# Patient Record
Sex: Female | Born: 1955 | Race: White | Hispanic: No | Marital: Single | Smoking: Never smoker
Health system: Southern US, Community
[De-identification: ages and names within clinical notes are randomized; demographics above are authoritative.]

## PROBLEM LIST (undated history)

## (undated) DIAGNOSIS — T783XXA Angioneurotic edema, initial encounter: Secondary | ICD-10-CM

## (undated) DIAGNOSIS — C801 Malignant (primary) neoplasm, unspecified: Secondary | ICD-10-CM

## (undated) DIAGNOSIS — L309 Dermatitis, unspecified: Secondary | ICD-10-CM

## (undated) DIAGNOSIS — E119 Type 2 diabetes mellitus without complications: Secondary | ICD-10-CM

## (undated) DIAGNOSIS — Z9889 Other specified postprocedural states: Secondary | ICD-10-CM

## (undated) DIAGNOSIS — R112 Nausea with vomiting, unspecified: Secondary | ICD-10-CM

## (undated) DIAGNOSIS — L509 Urticaria, unspecified: Secondary | ICD-10-CM

## (undated) DIAGNOSIS — J45909 Unspecified asthma, uncomplicated: Secondary | ICD-10-CM

## (undated) HISTORY — DX: Urticaria, unspecified: L50.9

## (undated) HISTORY — PX: EYE SURGERY: SHX253

## (undated) HISTORY — DX: Dermatitis, unspecified: L30.9

## (undated) HISTORY — DX: Type 2 diabetes mellitus without complications: E11.9

## (undated) HISTORY — DX: Angioneurotic edema, initial encounter: T78.3XXA

## (undated) HISTORY — PX: WRIST SURGERY: SHX841

---

## 1999-12-20 DIAGNOSIS — C801 Malignant (primary) neoplasm, unspecified: Secondary | ICD-10-CM

## 1999-12-20 HISTORY — DX: Malignant (primary) neoplasm, unspecified: C80.1

## 2004-10-13 ENCOUNTER — Emergency Department (HOSPITAL_COMMUNITY): Admission: EM | Admit: 2004-10-13 | Discharge: 2004-10-13 | Payer: Self-pay | Admitting: Emergency Medicine

## 2005-11-08 ENCOUNTER — Ambulatory Visit (HOSPITAL_COMMUNITY): Admission: RE | Admit: 2005-11-08 | Discharge: 2005-11-08 | Payer: Self-pay | Admitting: Vascular Surgery

## 2009-10-04 ENCOUNTER — Ambulatory Visit (HOSPITAL_COMMUNITY): Admission: RE | Admit: 2009-10-04 | Discharge: 2009-10-04 | Payer: Self-pay | Admitting: Internal Medicine

## 2013-06-24 ENCOUNTER — Other Ambulatory Visit: Payer: Self-pay | Admitting: Internal Medicine

## 2013-06-24 DIAGNOSIS — R928 Other abnormal and inconclusive findings on diagnostic imaging of breast: Secondary | ICD-10-CM

## 2013-06-24 DIAGNOSIS — Z853 Personal history of malignant neoplasm of breast: Secondary | ICD-10-CM

## 2013-06-24 DIAGNOSIS — Z9889 Other specified postprocedural states: Secondary | ICD-10-CM

## 2014-07-18 ENCOUNTER — Encounter (INDEPENDENT_AMBULATORY_CARE_PROVIDER_SITE_OTHER): Admitting: Ophthalmology

## 2014-07-18 DIAGNOSIS — H538 Other visual disturbances: Secondary | ICD-10-CM | POA: Diagnosis not present

## 2014-07-18 DIAGNOSIS — H43819 Vitreous degeneration, unspecified eye: Secondary | ICD-10-CM

## 2016-04-25 ENCOUNTER — Emergency Department (HOSPITAL_COMMUNITY): Payer: No Typology Code available for payment source

## 2016-04-25 ENCOUNTER — Encounter (HOSPITAL_COMMUNITY): Payer: Self-pay

## 2016-04-25 ENCOUNTER — Encounter (HOSPITAL_BASED_OUTPATIENT_CLINIC_OR_DEPARTMENT_OTHER): Payer: Self-pay | Admitting: *Deleted

## 2016-04-25 ENCOUNTER — Emergency Department (HOSPITAL_COMMUNITY)
Admission: EM | Admit: 2016-04-25 | Discharge: 2016-04-25 | Disposition: A | Discharge status: Home / Self Care | Payer: No Typology Code available for payment source | Attending: Emergency Medicine | Admitting: Emergency Medicine

## 2016-04-25 ENCOUNTER — Other Ambulatory Visit: Payer: Self-pay | Admitting: Orthopedic Surgery

## 2016-04-25 DIAGNOSIS — S52501A Unspecified fracture of the lower end of right radius, initial encounter for closed fracture: Secondary | ICD-10-CM | POA: Diagnosis not present

## 2016-04-25 DIAGNOSIS — S5291XA Unspecified fracture of right forearm, initial encounter for closed fracture: Secondary | ICD-10-CM

## 2016-04-25 DIAGNOSIS — Y999 Unspecified external cause status: Secondary | ICD-10-CM | POA: Diagnosis not present

## 2016-04-25 DIAGNOSIS — J45909 Unspecified asthma, uncomplicated: Secondary | ICD-10-CM | POA: Diagnosis not present

## 2016-04-25 DIAGNOSIS — Y92410 Unspecified street and highway as the place of occurrence of the external cause: Secondary | ICD-10-CM | POA: Insufficient documentation

## 2016-04-25 DIAGNOSIS — S6991XA Unspecified injury of right wrist, hand and finger(s), initial encounter: Secondary | ICD-10-CM | POA: Diagnosis present

## 2016-04-25 DIAGNOSIS — S52611A Displaced fracture of right ulna styloid process, initial encounter for closed fracture: Secondary | ICD-10-CM

## 2016-04-25 DIAGNOSIS — Y939 Activity, unspecified: Secondary | ICD-10-CM | POA: Insufficient documentation

## 2016-04-25 DIAGNOSIS — S52614A Nondisplaced fracture of right ulna styloid process, initial encounter for closed fracture: Secondary | ICD-10-CM | POA: Insufficient documentation

## 2016-04-25 HISTORY — DX: Unspecified asthma, uncomplicated: J45.909

## 2016-04-25 MED ORDER — OXYCODONE-ACETAMINOPHEN 5-325 MG PO TABS
1.0000 | ORAL_TABLET | Freq: Once | ORAL | Status: AC
Start: 2016-04-25 — End: 2016-04-25
  Administered 2016-04-25: 1 via ORAL
  Filled 2016-04-25: qty 1

## 2016-04-25 MED ORDER — HYDROMORPHONE HCL 1 MG/ML IJ SOLN: 1.0000 mg | Freq: Once | INTRAMUSCULAR | Status: DC

## 2016-04-25 MED ORDER — ONDANSETRON 4 MG PO TBDP
8.0000 mg | ORAL_TABLET | Freq: Once | ORAL | Status: AC
  Administered 2016-04-25: 8 mg via ORAL
  Filled 2016-04-25: qty 2

## 2016-04-25 NOTE — Discharge Instructions (Signed)
Discharge medications and follow-up appointments per physician assistant and registered nurse.

## 2016-04-25 NOTE — ED Notes (Signed)
Ortho at bedside.
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
## 2016-04-25 NOTE — ED Notes (Signed)
Ortho MD at bedside to splint patient's arm
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
## 2016-04-26 ENCOUNTER — Ambulatory Visit (HOSPITAL_BASED_OUTPATIENT_CLINIC_OR_DEPARTMENT_OTHER): Payer: No Typology Code available for payment source | Admitting: Anesthesiology

## 2016-04-26 ENCOUNTER — Encounter (HOSPITAL_BASED_OUTPATIENT_CLINIC_OR_DEPARTMENT_OTHER): Payer: Self-pay | Admitting: *Deleted

## 2016-04-26 ENCOUNTER — Ambulatory Visit (HOSPITAL_BASED_OUTPATIENT_CLINIC_OR_DEPARTMENT_OTHER)
Admission: RE | Admit: 2016-04-26 | Discharge: 2016-04-27 | Disposition: A | Discharge status: Home / Self Care | Payer: No Typology Code available for payment source | Source: Ambulatory Visit | Attending: Orthopedic Surgery | Admitting: Orthopedic Surgery

## 2016-04-26 ENCOUNTER — Encounter (HOSPITAL_BASED_OUTPATIENT_CLINIC_OR_DEPARTMENT_OTHER)
Admission: RE | Disposition: A | Discharge status: Home / Self Care | Payer: Self-pay | Source: Ambulatory Visit | Attending: Orthopedic Surgery

## 2016-04-26 DIAGNOSIS — Z79899 Other long term (current) drug therapy: Secondary | ICD-10-CM | POA: Diagnosis not present

## 2016-04-26 DIAGNOSIS — X58XXXA Exposure to other specified factors, initial encounter: Secondary | ICD-10-CM | POA: Diagnosis not present

## 2016-04-26 DIAGNOSIS — S52501A Unspecified fracture of the lower end of right radius, initial encounter for closed fracture: Secondary | ICD-10-CM | POA: Diagnosis present

## 2016-04-26 DIAGNOSIS — J45909 Unspecified asthma, uncomplicated: Secondary | ICD-10-CM | POA: Diagnosis not present

## 2016-04-26 DIAGNOSIS — S52571A Other intraarticular fracture of lower end of right radius, initial encounter for closed fracture: Secondary | ICD-10-CM | POA: Diagnosis not present

## 2016-04-26 HISTORY — DX: Other specified postprocedural states: Z98.890

## 2016-04-26 HISTORY — PX: OPEN REDUCTION INTERNAL FIXATION (ORIF) DISTAL RADIAL FRACTURE: SHX5989

## 2016-04-26 HISTORY — DX: Malignant (primary) neoplasm, unspecified: C80.1

## 2016-04-26 HISTORY — DX: Other specified postprocedural states: R11.2

## 2016-04-26 SURGERY — OPEN REDUCTION INTERNAL FIXATION (ORIF) DISTAL RADIUS FRACTURE
Anesthesia: Regional | Site: Wrist | Laterality: Right

## 2016-04-26 MED ORDER — ONDANSETRON HCL 4 MG PO TABS: 4.0000 mg | ORAL_TABLET | Freq: Four times a day (QID) | ORAL | Status: DC | PRN

## 2016-04-26 MED ORDER — MIDAZOLAM HCL 2 MG/2ML IJ SOLN
1.0000 mg | INTRAMUSCULAR | Status: DC | PRN
  Administered 2016-04-26: 2 mg via INTRAVENOUS

## 2016-04-26 MED ORDER — ONDANSETRON HCL 4 MG/2ML IJ SOLN: 4.0000 mg | Freq: Four times a day (QID) | INTRAMUSCULAR | Status: DC | PRN

## 2016-04-26 MED ORDER — SODIUM CHLORIDE 0.9 % IJ SOLN
INTRAMUSCULAR | Status: AC
  Filled 2016-04-26: qty 10

## 2016-04-26 MED ORDER — ONDANSETRON HCL 4 MG/2ML IJ SOLN
INTRAMUSCULAR | Status: DC | PRN
  Administered 2016-04-26: 4 mg via INTRAVENOUS

## 2016-04-26 MED ORDER — CEFAZOLIN SODIUM-DEXTROSE 2-4 GM/100ML-% IV SOLN
INTRAVENOUS | Status: AC
  Filled 2016-04-26: qty 100

## 2016-04-26 MED ORDER — MIDAZOLAM HCL 2 MG/2ML IJ SOLN
INTRAMUSCULAR | Status: AC
  Filled 2016-04-26: qty 2

## 2016-04-26 MED ORDER — PROPOFOL 10 MG/ML IV BOLUS
INTRAVENOUS | Status: DC | PRN
  Administered 2016-04-26: 20 mg via INTRAVENOUS

## 2016-04-26 MED ORDER — PROPOFOL 500 MG/50ML IV EMUL
INTRAVENOUS | Status: AC
  Filled 2016-04-26: qty 50

## 2016-04-26 MED ORDER — CEFAZOLIN SODIUM 1-5 GM-% IV SOLN
1.0000 g | Freq: Three times a day (TID) | INTRAVENOUS | Status: DC
  Administered 2016-04-26 – 2016-04-27 (×2): 1 g via INTRAVENOUS
  Filled 2016-04-26: qty 50

## 2016-04-26 MED ORDER — METHOCARBAMOL 1000 MG/10ML IJ SOLN
INTRAMUSCULAR | Status: AC
  Filled 2016-04-26: qty 10

## 2016-04-26 MED ORDER — FENTANYL CITRATE (PF) 100 MCG/2ML IJ SOLN
50.0000 ug | INTRAMUSCULAR | Status: DC | PRN
  Administered 2016-04-26: 100 ug via INTRAVENOUS

## 2016-04-26 MED ORDER — FAMOTIDINE 20 MG PO TABS: 20.0000 mg | ORAL_TABLET | Freq: Two times a day (BID) | ORAL | Status: DC | PRN

## 2016-04-26 MED ORDER — ONDANSETRON HCL 4 MG/2ML IJ SOLN: 4.0000 mg | Freq: Once | INTRAMUSCULAR | Status: DC | PRN

## 2016-04-26 MED ORDER — ONDANSETRON HCL 4 MG/2ML IJ SOLN
INTRAMUSCULAR | Status: AC
  Filled 2016-04-26: qty 2

## 2016-04-26 MED ORDER — CEFAZOLIN SODIUM 1-5 GM-% IV SOLN
1.0000 g | INTRAVENOUS | Status: DC
  Filled 2016-04-26: qty 50

## 2016-04-26 MED ORDER — DOCUSATE SODIUM 100 MG PO CAPS: 100.0000 mg | ORAL_CAPSULE | Freq: Two times a day (BID) | ORAL | Status: DC

## 2016-04-26 MED ORDER — PROMETHAZINE HCL 12.5 MG RE SUPP: 12.5000 mg | Freq: Four times a day (QID) | RECTAL | Status: DC | PRN

## 2016-04-26 MED ORDER — HYDROMORPHONE HCL 2 MG PO TABS
2.0000 mg | ORAL_TABLET | ORAL | Status: DC | PRN
  Administered 2016-04-27: 4 mg via ORAL
  Administered 2016-04-27: 2 mg via ORAL
  Filled 2016-04-26 (×3): qty 1

## 2016-04-26 MED ORDER — LACTATED RINGERS IV SOLN: INTRAVENOUS | Status: DC

## 2016-04-26 MED ORDER — LACTATED RINGERS IV SOLN
INTRAVENOUS | Status: DC
  Administered 2016-04-26 (×2): via INTRAVENOUS

## 2016-04-26 MED ORDER — GLYCOPYRROLATE 0.2 MG/ML IJ SOLN: 0.2000 mg | Freq: Once | INTRAMUSCULAR | Status: DC | PRN

## 2016-04-26 MED ORDER — CEFAZOLIN SODIUM-DEXTROSE 2-4 GM/100ML-% IV SOLN
2.0000 g | INTRAVENOUS | Status: AC
  Administered 2016-04-26: 2 g via INTRAVENOUS

## 2016-04-26 MED ORDER — ALPRAZOLAM 0.5 MG PO TABS
0.5000 mg | ORAL_TABLET | Freq: Four times a day (QID) | ORAL | Status: DC | PRN
  Administered 2016-04-26: 0.5 mg via ORAL
  Filled 2016-04-26: qty 2

## 2016-04-26 MED ORDER — PROMETHAZINE HCL 25 MG/ML IJ SOLN
6.2500 mg | Freq: Once | INTRAMUSCULAR | Status: AC
  Administered 2016-04-26: 6.25 mg via INTRAVENOUS

## 2016-04-26 MED ORDER — VITAMIN C 500 MG PO TABS: 1000.0000 mg | ORAL_TABLET | Freq: Every day | ORAL | Status: DC

## 2016-04-26 MED ORDER — PROMETHAZINE HCL 12.5 MG PO TABS
12.5000 mg | ORAL_TABLET | Freq: Four times a day (QID) | ORAL | Status: DC | PRN
  Administered 2016-04-27: 12.5 mg via ORAL
  Filled 2016-04-26: qty 1

## 2016-04-26 MED ORDER — HYDROMORPHONE HCL 1 MG/ML IJ SOLN: 0.2500 mg | INTRAMUSCULAR | Status: DC | PRN

## 2016-04-26 MED ORDER — PROMETHAZINE HCL 25 MG/ML IJ SOLN
12.5000 mg | Freq: Four times a day (QID) | INTRAMUSCULAR | Status: DC | PRN
  Administered 2016-04-27: 12.5 mg via INTRAVENOUS
  Filled 2016-04-26: qty 1

## 2016-04-26 MED ORDER — OXYCODONE HCL 5 MG PO TABS
5.0000 mg | ORAL_TABLET | ORAL | Status: DC | PRN
  Administered 2016-04-26: 10 mg via ORAL
  Filled 2016-04-26: qty 2

## 2016-04-26 MED ORDER — FENTANYL CITRATE (PF) 100 MCG/2ML IJ SOLN
INTRAMUSCULAR | Status: AC
  Filled 2016-04-26: qty 2

## 2016-04-26 MED ORDER — HYDROMORPHONE HCL 1 MG/ML IJ SOLN
0.5000 mg | INTRAMUSCULAR | Status: DC | PRN
  Administered 2016-04-26 (×2): 1 mg via INTRAVENOUS
  Filled 2016-04-26 (×2): qty 1

## 2016-04-26 MED ORDER — METHOCARBAMOL 1000 MG/10ML IJ SOLN
500.0000 mg | Freq: Four times a day (QID) | INTRAMUSCULAR | Status: DC | PRN
  Administered 2016-04-26 – 2016-04-27 (×2): 500 mg via INTRAVENOUS

## 2016-04-26 MED ORDER — CHLORHEXIDINE GLUCONATE 4 % EX LIQD: 60.0000 mL | Freq: Once | CUTANEOUS | Status: DC

## 2016-04-26 MED ORDER — METHOCARBAMOL 500 MG PO TABS: 500.0000 mg | ORAL_TABLET | Freq: Four times a day (QID) | ORAL | Status: DC | PRN

## 2016-04-26 MED ORDER — MEPERIDINE HCL 25 MG/ML IJ SOLN: 6.2500 mg | INTRAMUSCULAR | Status: DC | PRN

## 2016-04-26 MED ORDER — PROPOFOL 500 MG/50ML IV EMUL
INTRAVENOUS | Status: DC | PRN
  Administered 2016-04-26: 75 ug/kg/min via INTRAVENOUS

## 2016-04-26 MED ORDER — HYDROMORPHONE HCL 1 MG/ML IJ SOLN
1.0000 mg | INTRAMUSCULAR | Status: DC | PRN
  Administered 2016-04-27: 1 mg via INTRAVENOUS
  Filled 2016-04-26: qty 1

## 2016-04-26 MED ORDER — PROMETHAZINE HCL 25 MG/ML IJ SOLN
INTRAMUSCULAR | Status: AC
  Filled 2016-04-26: qty 1

## 2016-04-26 MED ORDER — SCOPOLAMINE 1 MG/3DAYS TD PT72: 1.0000 | MEDICATED_PATCH | Freq: Once | TRANSDERMAL | Status: DC | PRN

## 2016-04-26 SURGICAL SUPPLY — 66 items
BANDAGE ACE 3X5.8 VEL STRL LF (GAUZE/BANDAGES/DRESSINGS) ×3 IMPLANT
BANDAGE ACE 4X5 VEL STRL LF (GAUZE/BANDAGES/DRESSINGS) ×3 IMPLANT
BIT DRILL 2.2 SS TIBIAL (BIT) ×3 IMPLANT
BLADE CARPAL TUNNEL SNGL USE (BLADE) IMPLANT
BLADE SURG 15 STRL LF DISP TIS (BLADE) ×2 IMPLANT
BLADE SURG 15 STRL SS (BLADE) ×4
BNDG CONFORM 3 STRL LF (GAUZE/BANDAGES/DRESSINGS) ×3 IMPLANT
BRUSH SCRUB EZ PLAIN DRY (MISCELLANEOUS) ×3 IMPLANT
CORDS BIPOLAR (ELECTRODE) ×3 IMPLANT
COVER BACK TABLE 60X90IN (DRAPES) ×3 IMPLANT
CUFF TOURNIQUET SINGLE 18IN (TOURNIQUET CUFF) ×3 IMPLANT
DRAIN PENROSE 1/4X12 LTX STRL (WOUND CARE) IMPLANT
DRAPE EXTREMITY T 121X128X90 (DRAPE) ×3 IMPLANT
DRAPE SURG 17X23 STRL (DRAPES) ×3 IMPLANT
DRSG EMULSION OIL 3X3 NADH (GAUZE/BANDAGES/DRESSINGS) ×3 IMPLANT
GAUZE SPONGE 4X4 12PLY STRL (GAUZE/BANDAGES/DRESSINGS) IMPLANT
GAUZE SPONGE 4X4 16PLY XRAY LF (GAUZE/BANDAGES/DRESSINGS) IMPLANT
GAUZE XEROFORM 1X8 LF (GAUZE/BANDAGES/DRESSINGS) ×3 IMPLANT
GAUZE XEROFORM 5X9 LF (GAUZE/BANDAGES/DRESSINGS) ×3 IMPLANT
GLOVE BIO SURGEON STRL SZ 6.5 (GLOVE) ×2 IMPLANT
GLOVE BIO SURGEONS STRL SZ 6.5 (GLOVE) ×1
GLOVE BIOGEL M STRL SZ7.5 (GLOVE) IMPLANT
GLOVE BIOGEL PI IND STRL 7.0 (GLOVE) ×1 IMPLANT
GLOVE BIOGEL PI INDICATOR 7.0 (GLOVE) ×2
GLOVE SS BIOGEL STRL SZ 8 (GLOVE) ×1 IMPLANT
GLOVE SUPERSENSE BIOGEL SZ 8 (GLOVE) ×2
GOWN STRL REUS W/ TWL LRG LVL3 (GOWN DISPOSABLE) ×1 IMPLANT
GOWN STRL REUS W/ TWL XL LVL3 (GOWN DISPOSABLE) ×1 IMPLANT
GOWN STRL REUS W/TWL LRG LVL3 (GOWN DISPOSABLE) ×2
GOWN STRL REUS W/TWL XL LVL3 (GOWN DISPOSABLE) ×2
LOOP VESSEL MAXI BLUE (MISCELLANEOUS) IMPLANT
NDL SAFETY ECLIPSE 18X1.5 (NEEDLE) IMPLANT
NEEDLE HYPO 18GX1.5 SHARP (NEEDLE)
NEEDLE HYPO 22GX1.5 SAFETY (NEEDLE) IMPLANT
NEEDLE HYPO 25X1 1.5 SAFETY (NEEDLE) IMPLANT
NS IRRIG 1000ML POUR BTL (IV SOLUTION) ×3 IMPLANT
PACK BASIN DAY SURGERY FS (CUSTOM PROCEDURE TRAY) ×3 IMPLANT
PAD ALCOHOL SWAB (MISCELLANEOUS) ×24 IMPLANT
PAD CAST 3X4 CTTN HI CHSV (CAST SUPPLIES) ×2 IMPLANT
PAD CAST 4YDX4 CTTN HI CHSV (CAST SUPPLIES) ×1 IMPLANT
PADDING CAST ABS 4INX4YD NS (CAST SUPPLIES)
PADDING CAST ABS COTTON 4X4 ST (CAST SUPPLIES) IMPLANT
PADDING CAST COTTON 3X4 STRL (CAST SUPPLIES) ×4
PADDING CAST COTTON 4X4 STRL (CAST SUPPLIES) ×2
PEG LOCKING SMOOTH 2.2X18 (Peg) ×6 IMPLANT
PEG LOCKING SMOOTH 2.2X20 (Screw) ×6 IMPLANT
PEG LOCKING SMOOTH 2.2X22 (Screw) ×3 IMPLANT
PLATE MEDIUM DVR LEFT (Plate) ×3 IMPLANT
PLATE MEDIUM DVR RIGHT (Plate) ×3 IMPLANT
PUTTY DBM STAGRAFT 5CC (Putty) ×3 IMPLANT
SCREW LOCK 14X2.7X 3 LD TPR (Screw) ×3 IMPLANT
SCREW LOCKING 2.7X13MM (Screw) ×6 IMPLANT
SCREW LOCKING 2.7X14 (Screw) ×6 IMPLANT
SCREW LOCKING 2.7X15MM (Screw) ×3 IMPLANT
SCREW MULTI DIRECTIONAL 2.7X18 (Screw) ×3 IMPLANT
SHEET MEDIUM DRAPE 40X70 STRL (DRAPES) ×3 IMPLANT
SPLINT FIBERGLASS 4X30 (CAST SUPPLIES) ×3 IMPLANT
STOCKINETTE 4X48 STRL (DRAPES) ×3 IMPLANT
SUT PROLENE 4 0 PS 2 18 (SUTURE) ×6 IMPLANT
SYR BULB 3OZ (MISCELLANEOUS) ×3 IMPLANT
SYR CONTROL 10ML LL (SYRINGE) IMPLANT
SYSTEM CHEST DRAIN TLS 7FR (DRAIN) ×3 IMPLANT
TOWEL OR 17X24 6PK STRL BLUE (TOWEL DISPOSABLE) ×6 IMPLANT
TOWEL OR NON WOVEN STRL DISP B (DISPOSABLE) ×3 IMPLANT
TRAY DSU PREP LF (CUSTOM PROCEDURE TRAY) ×3 IMPLANT
UNDERPAD 30X30 (UNDERPADS AND DIAPERS) ×3 IMPLANT

## 2016-04-26 NOTE — H&P (Signed)
  Please see yesterday's thorough consultation.  Patient has a displaced right distal radius fracture in the face of a recent [place] thumb arthroplasty. We will plan for ORIF.  Patient stands all risk and benefits.  She has no allergies she is doing quite well and in good spirits. She denies neck pain today.  She denies carpal tunnel symptoms today.  The patient is alert and oriented in no acute distress. The patient complains of pain in the affected upper extremity.  The patient is noted to have a normal HEENT exam. Lung fields show equal chest expansion and no shortness of breath. Abdomen exam is nontender without distention. Lower extremity examination does not show any fracture dislocation or blood clot symptoms. Pelvis is stable and the neck and back are stable and nontender.  We are planning surgery for your upper extremity. The risk and benefits of surgery to include risk of bleeding, infection, anesthesia,  damage to normal structures and failure of the surgery to accomplish its intended goals of relieving symptoms and restoring function have been discussed in detail. With this in mind we plan to proceed. I have specifically discussed with the patient the pre-and postoperative regime and the dos and don'ts and risk and benefits in great detail. Risk and benefits of surgery also include risk of dystrophy(CRPS), chronic nerve pain, failure of the healing process to go onto completion and other inherent risks of surgery The relavent the pathophysiology of the disease/injury process, as well as the alternatives for treatment and postoperative course of action has been discussed in great detail with the patient who desires to proceed.  We will do everything in our power to help you (the patient) restore function to the upper extremity. It is a pleasure to see this patient today.    Arnetha Silverthorne MD

## 2016-04-26 NOTE — Anesthesia Preprocedure Evaluation (Signed)
Anesthesia Evaluation  Patient identified by MRN, date of birth, ID band Patient awake    Reviewed: Allergy & Precautions, NPO status , Patient's Chart, lab work & pertinent test results  History of Anesthesia Complications (+) PONV  Airway Mallampati: I  TM Distance: >3 FB Neck ROM: Full    Dental   Pulmonary asthma ,    Pulmonary exam normal        Cardiovascular Normal cardiovascular exam     Neuro/Psych    GI/Hepatic   Endo/Other    Renal/GU      Musculoskeletal   Abdominal   Peds  Hematology   Anesthesia Other Findings   Reproductive/Obstetrics                             Anesthesia Physical Anesthesia Plan  ASA: II  Anesthesia Plan: Regional   Post-op Pain Management:    Induction: Intravenous  Airway Management Planned: Natural Airway  Additional Equipment:   Intra-op Plan:   Post-operative Plan:   Informed Consent: I have reviewed the patients History and Physical, chart, labs and discussed the procedure including the risks, benefits and alternatives for the proposed anesthesia with the patient or authorized representative who has indicated his/her understanding and acceptance.     Plan Discussed with: CRNA and Surgeon  Anesthesia Plan Comments:         Anesthesia Quick Evaluation

## 2016-04-26 NOTE — Op Note (Signed)
NAMEADELIS, MALES                ACCOUNT NO.:  [id number]  MEDICAL RECORD NO.:  [id number]  LOCATION:                                 FACILITY:  PHYSICIAN:  Satira Anis. Raphaella Larkin, M.D.DATE OF BIRTH:  25-Jan-1956  DATE OF PROCEDURE: DATE OF DISCHARGE:                              OPERATIVE REPORT   PREOPERATIVE DIAGNOSES: 1. Comminuted complex intra-articular greater than five-part distal     radius fracture, right upper extremity. 2. History of carpometacarpal arthroplasty of the thumb, less than 4     months ago.  POSTOPERATIVE DIAGNOSES: 1. Comminuted complex intra-articular greater than five-part distal     radius fracture, right upper extremity. 2. History of carpometacarpal arthroplasty of the thumb, less than 4     months ago.  PROCEDURES: 1. Open reduction and internal fixation of comminuted complex distal     radius fracture with a DVR plate and screw construct (Crosslock     plate) from [employer].  This patient also underwent allograft bone     grafting in the form of StaGraft secondary to metaphyseal     comminution. 2. AP, lateral and oblique x-rays performed, examined and interpreted     by myself. 3. FCR tenolysis, tenosynovectomy.  SURGEON:  Satira Anis. Amedeo Plenty, M.D.  ASSISTANT:  None.  COMPLICATION:  None.  ANESTHESIA:  Block with IV sedation.  TOURNIQUET TIME:  Just an hour.  DRAINS:  One.  INDICATIONS:  The patient is a pleasant female, who presents with the above-mentioned diagnosis.  I have counseled her in regard to risks and benefits of the surgery including the risk of infection, bleeding, anesthesia, damage to normal structures and failure of surgery to accomplish its intended goals of relieving symptoms and restoring function.  With this in mind, she desires to proceed.  All questions have been encouraged and answered preoperatively.  OPERATIVE PROCEDURE:  The patient was seen by myself and Anesthesia, taken to the operative suite, underwent  smooth induction of IV sedation, preoperative block was placed.  I prepped her with Hibiclens scrub followed by 10 minutes surgical Betadine scrub.  Sterile field was secured.  Outline marks made.  Arm elevated, tourniquet was insufflated to 250 mmHg.  Volar radial incision was made.  Dissection was carried down.  FCR tendon, underwent a tenolysis, tenosynovectomy and looked to be in excellent condition and quality.  It was retracted out of harm's way, fasciotomy completed.  I incised the pronator and retracted the carpal canal contents ulnarly.  Preoperatively, she did not have excessive numbness and tingling or acute carpal tunnel symptomatology. Once we exposed the radius, it was quite clear that she had a very significantly comminuted fracture, which was highly displaced.  At this time, I went about reassembling the fracture fragments, placed 5 mL of StaGraft bone graft in the vicinity of the defect, packed this nicely and then placed provisional fixation with the Kirschner wire. Provisional fixation was placed.  There were no complicating features. Following this, a DVR plate and screw construct was accomplished to achieve adequate radial height, inclination and volar tilt.  She had good stability.  However, I should note there was a large degree of comminution.  Thus, I was very careful to use a bit longer plate and to make sure we had excellent purchase and stability.  Distal radioulnar joint was stable.  Radiocarpal and midcarpal joints were stable.  The prior [facility] arthroplasty was stable.  I irrigated copiously, closed the pronator, took final copy x-rays and demonstrated excellent stability.  Following this, we then closed the wound in layers of 3-0 Vicryl about the pronator followed by TLS drain followed by skin edge closure with Prolene.  The patient tolerated this well.  There were no complicating features.  All sponge, needle, and instrument counts were reported as correct.   The patient tolerated the procedure quite nicely.  We will monitor her condition.  Splint was placed and once again, the distal radioulnar joint and radiocarpal joint looked excellent.  This was very comminuted complex fracture, this will go bit slower than usual in this patient's care plan postoperatively.  She understands this.  We will be having her spend the night for IV antibiotics, general postop observatory care.  Should problems arise, we will be immediately available.     Satira Anis. Amedeo Plenty, M.D.   ______________________________ Satira Anis. Amedeo Plenty, M.D.    [facility]  D:  04/26/2016  T:  04/26/2016  Job:  [id number]

## 2016-04-26 NOTE — Op Note (Signed)
See dictation [id number] Mosie Epstein.D.

## 2016-04-26 NOTE — Anesthesia Postprocedure Evaluation (Signed)
Anesthesia Post Note  Patient: Karla Dyer  Procedure(s) Performed: Procedure(s) (LRB): OPEN REDUCTION INTERNAL FIXATION (ORIF) RIGHT WRIST DISTAL RADIAL FRACTURE (Right)  Patient location during evaluation: PACU Anesthesia Type: Regional Level of consciousness: awake and alert and patient cooperative Pain management: pain level controlled Vital Signs Assessment: post-procedure vital signs reviewed and stable Respiratory status: spontaneous breathing and respiratory function stable Cardiovascular status: stable Anesthetic complications: no    Last Vitals:  Filed Vitals:   04/26/16 1400 04/26/16 1430  BP: 128/80 113/71  Pulse: 83 86  Temp:  36.8 C  Resp: 15 16    Last Pain:  Filed Vitals:   04/26/16 1552  PainSc: Comunas DAVID

## 2016-04-26 NOTE — Transfer of Care (Signed)
Immediate Anesthesia Transfer of Care Note  Patient: Karla Dyer  Procedure(s) Performed: Procedure(s): OPEN REDUCTION INTERNAL FIXATION (ORIF) RIGHT WRIST DISTAL RADIAL FRACTURE (Right)  Patient Location: PACU  Anesthesia Type:MAC combined with regional for post-op pain  Level of Consciousness: awake, alert  and oriented  Airway & Oxygen Therapy: Patient Spontanous Breathing and Patient connected to face mask oxygen  Post-op Assessment: Report given to RN and Post -op Vital signs reviewed and stable  Post vital signs: Reviewed and stable  Last Vitals:  Filed Vitals:   04/26/16 1020 04/26/16 1025  BP: 131/67 130/69  Pulse: 103 104  Temp:    Resp: 12 20    Last Pain:  Filed Vitals:   04/26/16 1026  PainSc: 5       Patients Stated Pain Goal: 5 ([id number])  Complications: No apparent anesthesia complications

## 2016-04-26 NOTE — Progress Notes (Signed)
Assisted Dr. Ossey with right, ultrasound guided, supraclavicular block. Side rails up, monitors on throughout procedure. See vital signs in flow sheet. Tolerated Procedure well. 

## 2016-04-27 DIAGNOSIS — S52571A Other intraarticular fracture of lower end of right radius, initial encounter for closed fracture: Secondary | ICD-10-CM | POA: Diagnosis not present

## 2016-04-27 MED ORDER — DOCUSATE SODIUM 100 MG PO CAPS
100.0000 mg | ORAL_CAPSULE | Freq: Two times a day (BID) | ORAL | Status: DC
Start: 2016-04-27 — End: 2023-02-25

## 2016-04-27 MED ORDER — ASCORBIC ACID 1000 MG PO TABS: 1000.0000 mg | ORAL_TABLET | Freq: Every day | ORAL | Status: AC

## 2016-04-27 MED ORDER — HYDROMORPHONE HCL 2 MG PO TABS: 4.0000 mg | ORAL_TABLET | ORAL | Status: DC | PRN

## 2016-04-27 MED ORDER — CEPHALEXIN 500 MG PO CAPS: 500.0000 mg | ORAL_CAPSULE | Freq: Four times a day (QID) | ORAL | Status: DC

## 2016-04-27 MED ORDER — PROMETHAZINE HCL 12.5 MG PO TABS: 12.5000 mg | ORAL_TABLET | Freq: Four times a day (QID) | ORAL | Status: DC | PRN

## 2016-04-27 NOTE — Discharge Summary (Signed)
  Patient has been seen and examined. Patient has pain appropriate to his injury/process. Patient denies new complaints at this present time. I have discussed the care pathway with nursing staff. Patient is appropriate and alert.  We reviewed vital signs and intake output which are stable.  The upper extremity is neurovascularly intact. Refill is normal. There is no signs of compartment syndrome. There is no signs of dystrophy. There is normal sensation.  I have spent a  great deal of time discussing range of motion edema control and other techniques to decrease edema and promote flexion extension of the fingers. Patient understands the importance of elevation range of motion massage and other measures to lessen pain and prevent swelling.  We have also discussed immobilization to appropriate areas involved.  We have discussed with the patient shoulder range of motion to prevent adhesive capsulitis.  The remainder of the examination is normal today without complicating feature.  Drain was removed without difficulty  Patient will be discharged home. Will plan to see the patient back in the office as per discharge instructions (please see discharge instructions).  Patient had an uneventful hospital course. At the time of discharge patient is stable awake alert and oriented in no acute distress. Regular diet will be continued and has been tolerated. Patient will notify should have problems occur. There is no signs of DVT infection or other complication at this juncture.  All questions have been incurred and answered.  Please see discharge med list  she is NVI  RTC 7 Days Karla Mcroy MD

## 2016-04-27 NOTE — Discharge Instructions (Addendum)

## 2016-04-29 ENCOUNTER — Encounter (HOSPITAL_BASED_OUTPATIENT_CLINIC_OR_DEPARTMENT_OTHER): Payer: Self-pay | Admitting: Orthopedic Surgery

## 2016-05-01 ENCOUNTER — Encounter (HOSPITAL_BASED_OUTPATIENT_CLINIC_OR_DEPARTMENT_OTHER): Payer: Self-pay | Admitting: Orthopedic Surgery

## 2016-05-10 ENCOUNTER — Other Ambulatory Visit: Payer: Self-pay | Admitting: Orthopedic Surgery

## 2016-05-10 ENCOUNTER — Ambulatory Visit
Admission: RE | Admit: 2016-05-10 | Discharge: 2016-05-10 | Disposition: A | Discharge status: Home / Self Care | Source: Ambulatory Visit | Attending: Orthopedic Surgery | Admitting: Orthopedic Surgery

## 2016-05-10 DIAGNOSIS — R519 Headache, unspecified: Secondary | ICD-10-CM

## 2016-05-10 DIAGNOSIS — R51 Headache: Principal | ICD-10-CM

## 2017-09-08 ENCOUNTER — Other Ambulatory Visit: Payer: Self-pay | Admitting: Gastroenterology

## 2017-09-08 DIAGNOSIS — R1310 Dysphagia, unspecified: Secondary | ICD-10-CM

## 2017-09-08 DIAGNOSIS — R1319 Other dysphagia: Secondary | ICD-10-CM

## 2017-09-12 ENCOUNTER — Other Ambulatory Visit

## 2017-09-15 ENCOUNTER — Other Ambulatory Visit: Payer: Self-pay | Admitting: Gastroenterology

## 2017-09-15 ENCOUNTER — Ambulatory Visit
Admission: RE | Admit: 2017-09-15 | Discharge: 2017-09-15 | Disposition: A | Discharge status: Home / Self Care | Source: Ambulatory Visit | Attending: Gastroenterology | Admitting: Gastroenterology

## 2017-09-15 DIAGNOSIS — R1310 Dysphagia, unspecified: Secondary | ICD-10-CM

## 2017-09-15 DIAGNOSIS — R1319 Other dysphagia: Secondary | ICD-10-CM

## 2017-09-23 ENCOUNTER — Other Ambulatory Visit: Payer: Self-pay | Admitting: Gastroenterology

## 2017-09-30 NOTE — Progress Notes (Signed)
Patient called and confirmed appointment for esophageal manometry on 11/14 at 0830.    Vista Lawman, RN

## 2017-10-01 ENCOUNTER — Encounter (HOSPITAL_COMMUNITY)
Admission: RE | Disposition: A | Discharge status: Home / Self Care | Payer: Self-pay | Source: Ambulatory Visit | Attending: Gastroenterology

## 2017-10-01 ENCOUNTER — Ambulatory Visit (HOSPITAL_COMMUNITY)
Admission: RE | Admit: 2017-10-01 | Discharge: 2017-10-01 | Disposition: A | Discharge status: Home / Self Care | Source: Ambulatory Visit | Attending: Gastroenterology | Admitting: Gastroenterology

## 2017-10-01 DIAGNOSIS — R1310 Dysphagia, unspecified: Secondary | ICD-10-CM | POA: Insufficient documentation

## 2017-10-01 HISTORY — PX: ESOPHAGEAL MANOMETRY: SHX5429

## 2017-10-01 SURGERY — MANOMETRY, ESOPHAGUS

## 2017-10-01 MED ORDER — LIDOCAINE VISCOUS 2 % MT SOLN
OROMUCOSAL | Status: AC
Start: 2017-10-01 — End: ?
  Filled 2017-10-01: qty 15

## 2017-10-01 SURGICAL SUPPLY — 2 items
FACESHIELD LNG OPTICON STERILE (SAFETY) IMPLANT
GLOVE BIO SURGEON STRL SZ8 (GLOVE) ×6 IMPLANT

## 2017-10-01 NOTE — Progress Notes (Signed)
Esophageal Manometry done per protocol.. Pt tolerated well without distress or complications. Dr.Schooler to be made aware to read study.

## 2017-10-02 ENCOUNTER — Encounter (HOSPITAL_COMMUNITY): Payer: Self-pay | Admitting: Gastroenterology

## 2017-10-18 ENCOUNTER — Ambulatory Visit (HOSPITAL_COMMUNITY): Admit: 2017-10-18 | Admitting: Gastroenterology

## 2017-10-18 ENCOUNTER — Encounter (HOSPITAL_COMMUNITY): Payer: Self-pay

## 2017-10-18 SURGERY — MANOMETRY, ESOPHAGUS

## 2018-07-14 ENCOUNTER — Other Ambulatory Visit: Payer: Self-pay | Admitting: Obstetrics and Gynecology

## 2018-07-14 DIAGNOSIS — Z853 Personal history of malignant neoplasm of breast: Secondary | ICD-10-CM

## 2018-07-28 ENCOUNTER — Other Ambulatory Visit

## 2018-08-07 ENCOUNTER — Ambulatory Visit
Admission: RE | Admit: 2018-08-07 | Discharge: 2018-08-07 | Disposition: A | Discharge status: Home / Self Care | Source: Ambulatory Visit | Attending: Obstetrics and Gynecology | Admitting: Obstetrics and Gynecology

## 2018-08-07 DIAGNOSIS — Z853 Personal history of malignant neoplasm of breast: Secondary | ICD-10-CM

## 2018-08-07 MED ORDER — GADOBENATE DIMEGLUMINE 529 MG/ML IV SOLN
15.0000 mL | Freq: Once | INTRAVENOUS | Status: AC | PRN
  Administered 2018-08-07: 15 mL via INTRAVENOUS

## 2019-12-26 ENCOUNTER — Ambulatory Visit: Payer: Medicare Other | Attending: Internal Medicine

## 2019-12-26 DIAGNOSIS — Z23 Encounter for immunization: Secondary | ICD-10-CM | POA: Insufficient documentation

## 2019-12-26 NOTE — Progress Notes (Signed)
   Covid-19 Vaccination Clinic  Name:  Karla Dyer    MRN: [id number] DOB: [birth year]  12/26/2019  Karla Dyer was observed post Covid-19 immunization for 15 minutes without incidence. She was provided with Vaccine Information Sheet and instruction to access the V-Safe system.   Karla Dyer was instructed to call 911 with any severe reactions post vaccine: Marland Kitchen Difficulty breathing  . Swelling of your face and throat  . A fast heartbeat  . A bad rash all over your body  . Dizziness and weakness    Immunizations Administered    Name Date Dose VIS Date Route   Pfizer COVID-19 Vaccine 12/26/2019 12:58 PM 0.3 mL 10/29/2019 Intramuscular   Manufacturer: [place]   Lot: [id number]   [place]: [id number]

## 2020-01-10 ENCOUNTER — Ambulatory Visit

## 2020-01-19 ENCOUNTER — Ambulatory Visit: Payer: TRICARE For Life (TFL)

## 2020-01-19 ENCOUNTER — Ambulatory Visit: Payer: Medicare Other | Attending: Internal Medicine

## 2020-01-19 DIAGNOSIS — Z23 Encounter for immunization: Secondary | ICD-10-CM

## 2020-01-19 NOTE — Progress Notes (Signed)
   Covid-19 Vaccination Clinic  Name:  Karla Dyer    MRN: [id number] DOB: Jul 09, 1956  01/19/2020  [name] was observed post Covid-19 immunization for 15 minutes without incident. She was provided with Vaccine Information Sheet and instruction to access the V-Safe system.   [name] was instructed to call 911 with any severe reactions post vaccine: Marland Kitchen Difficulty breathing  . Swelling of face and throat  . A fast heartbeat  . A bad rash all over body  . Dizziness and weakness   Immunizations Administered    Name Date Dose VIS Date Route   Pfizer COVID-19 Vaccine 01/19/2020 12:40 PM 0.3 mL 10/29/2019 Intramuscular   Manufacturer: [place]   Lot: [id number]   [place]: [id number]

## 2020-06-15 ENCOUNTER — Encounter: Payer: Medicare Other | Attending: Family Medicine | Admitting: Skilled Nursing Facility1

## 2020-06-15 ENCOUNTER — Other Ambulatory Visit: Payer: Self-pay

## 2020-06-15 ENCOUNTER — Encounter: Payer: Self-pay | Admitting: Skilled Nursing Facility1

## 2020-06-15 DIAGNOSIS — E119 Type 2 diabetes mellitus without complications: Secondary | ICD-10-CM | POA: Insufficient documentation

## 2020-06-15 NOTE — Progress Notes (Signed)
Pt states the good thing about having diabetes is losing weight unintentionally: Dietitian educated pt on the mening of unintentional weight loss, ramifications of this, as well as the cause being uncontrolled diabetes.  Pt states she has been experiencing Hair loss: dietitian educated pt on this possibly being due to the unintentional weight loss and malnutrition citing sub orbital fat loss and sinking with a dark appearance; pt states no that is not it.  Pt states she is very sure she is not malnourished stating she does not care anymore and has been throwing up a lot: Dietitian attempted to glean more information on this topic but pt would not elaborate. Pt states she eats very healthy it is not her foods: Dietitian educated pt on the pathophysiology of uncontrolled diabetes describing glucose not getting into her actual cells.   Pt States she eats all the time from many different cultures so she know she eats healthy. Pt states she loves sweet tea and will drink an entire gallon and will eat an entire package of twizzlers.  Pt states she has been checking her blood sugars 4-5 times a day. Pt states she has not been taking her insulin due to not having and replacement needles at the pharmacy.  Pt staets she does not eat sugar or salt.  Pt states she does not like to be told what to do and when people tell her what to do she will not do it. Pt states insulin and metformin are controlling her and telling her what to do and does not want to do it: Dietitian educated the pt on the roll metformin and insulin play in controlling her diabetes and the consequences of not taking them properly.  Pt states she Sold everything will go to [place] the rest of her life just waiting for her house to sell.  Pt states she is Not calling her doctor ever again and will not call [facility] ever again. Pt states she is going to see Dr. Buddy Duty as an endocrinologist. Pt states she does not trust her doctor because she did not  get a ultrasound to find the cancer that is causing her diabetes: Dietitian educated pt on superfluous testing being expensive and if there is no evidence to support an ultrasound the reasoning is not there to support getting one: Pt revisited this complaint multiple times throughout the visit.  Pt believes she was misdiagnosed and believes she has cancer on her pancrease causing the diabetes.   Pt was not interested in education about her diet as she believes her diet to be "perfectly healthy."  Education Topics: Insulin and metformin mechanism of action pathophysiology of diabetes including models and pictures Uncontrolled diabetes verses controlled diabetes   Pt was given type 2 diabetes support group information.   Pt was given diabetes information booklets and handouts.   24 hr recall:  Meal: Chicken thigh and pear carrots Meal: Chicken breast + broccoli + apple zucchini Meal: Shrimp + grapes + snow peas  Beverages: herbal tea

## 2020-10-16 ENCOUNTER — Ambulatory Visit (INDEPENDENT_AMBULATORY_CARE_PROVIDER_SITE_OTHER): Payer: Medicare Other | Admitting: Ophthalmology

## 2020-10-16 ENCOUNTER — Other Ambulatory Visit: Payer: Self-pay

## 2020-10-16 ENCOUNTER — Encounter (INDEPENDENT_AMBULATORY_CARE_PROVIDER_SITE_OTHER): Payer: Self-pay | Admitting: Ophthalmology

## 2020-10-16 DIAGNOSIS — E119 Type 2 diabetes mellitus without complications: Secondary | ICD-10-CM | POA: Insufficient documentation

## 2020-10-16 DIAGNOSIS — H43393 Other vitreous opacities, bilateral: Secondary | ICD-10-CM | POA: Insufficient documentation

## 2020-10-16 DIAGNOSIS — H43811 Vitreous degeneration, right eye: Secondary | ICD-10-CM | POA: Diagnosis not present

## 2020-10-16 DIAGNOSIS — H43812 Vitreous degeneration, left eye: Secondary | ICD-10-CM

## 2020-10-16 NOTE — Progress Notes (Signed)
10/16/2020     CHIEF COMPLAINT Patient presents for Retina Follow Up   HISTORY OF PRESENT ILLNESS: Karla Dyer is a 64 y.o. female who presents to the clinic today for:   HPI    Retina Follow Up    Patient presents with  Other.  In both eyes.  This started 2.5 years ago.  Severity is mild.  Duration of 2.5 years.  Since onset it is stable.          Comments    2.5 Year F/U OU  Pt c/o continued constant floaters OU. Pt sts VA OU is still poor. Pt dx diabetic x 4 mo ago. A1c: does not recall LBS: "almost 400" when dx diabetic 4 mo ago       Last edited by Rockie Neighbours, [place] on 10/16/2020  9:05 AM. (History)      Referring physician: Cari Caraway, MD [place],  [place] [id number]  HISTORICAL INFORMATION:   Selected notes from the [place]: No current outpatient medications on file. (Ophthalmic Drugs)   No current facility-administered medications for this visit. (Ophthalmic Drugs)   Current Outpatient Medications (Other)  Medication Sig  . albuterol (PROVENTIL HFA;VENTOLIN HFA) 108 (90 Base) MCG/ACT inhaler Inhale into the lungs every 6 (six) hours as needed for wheezing or shortness of breath.  . cephALEXin (KEFLEX) 500 MG capsule Take 1 capsule (500 mg total) by mouth 4 (four) times daily.  Marland Kitchen docusate sodium (COLACE) 100 MG capsule Take 1 capsule (100 mg total) by mouth 2 (two) times daily.  Marland Kitchen HYDROmorphone (DILAUDID) 2 MG tablet Take 2 tablets (4 mg total) by mouth every 3 (three) hours as needed for moderate pain or severe pain (1-2 tablets PO PRN).  Marland Kitchen loratadine (CLARITIN) 10 MG tablet Take 10 mg by mouth daily.  . methocarbamol (ROBAXIN) 500 MG tablet Take 500 mg by mouth every 8 (eight) hours as needed for muscle spasms.  Marland Kitchen omeprazole (PRILOSEC) 40 MG capsule Take 40 mg by mouth daily.  . promethazine (PHENERGAN) 12.5 MG tablet Take 1 tablet (12.5 mg total) by mouth every 6 (six) hours as needed for  nausea.  . vitamin C (VITAMIN C) 1000 MG tablet Take 1 tablet (1,000 mg total) by mouth daily.   No current facility-administered medications for this visit. (Other)      REVIEW OF SYSTEMS:    ALLERGIES No Known Allergies  PAST MEDICAL HISTORY Past Medical History:  Diagnosis Date  . Asthma   . Cancer ([place]) 12/20/99   breast  . Diabetes mellitus without complication ([place])   . PONV (postoperative nausea and vomiting)    Past Surgical History:  Procedure Laterality Date  . CESAREAN SECTION    . ESOPHAGEAL MANOMETRY N/A 10/01/2017   Procedure: ESOPHAGEAL MANOMETRY (EM);  Surgeon: Wilford Corner, MD;  Location: [facility];  Service: Endoscopy;  Laterality: N/A;  . EYE SURGERY    . OPEN REDUCTION INTERNAL FIXATION (ORIF) DISTAL RADIAL FRACTURE Right 04/26/2016   Procedure: OPEN REDUCTION INTERNAL FIXATION (ORIF) RIGHT WRIST DISTAL RADIAL FRACTURE;  Surgeon: Roseanne Kaufman, MD;  Location: [facility];  Service: Orthopedics;  Laterality: Right;  . WRIST SURGERY      FAMILY HISTORY History reviewed. No pertinent family history.  SOCIAL HISTORY Social History   Tobacco Use  . Smoking status: Never Smoker  . Smokeless tobacco: Never Used  Substance Use Topics  . Alcohol use: No  .  Drug use: No         OPHTHALMIC EXAM:  Base Eye Exam    Visual Acuity (ETDRS)      Right Left   Dist [place] 20/20 20/20 -2       Tonometry (Tonopen, 9:05 AM)      Right Left   Pressure 12 14       Pupils      Pupils Dark Light Shape React APD   Right PERRL 4 3 Round Brisk None   Left PERRL 4 3 Round Brisk None       Visual Fields (Counting fingers)      Left Right    Full Full       Extraocular Movement      Right Left    Full Full       Neuro/Psych    Oriented x3: Yes   Mood/Affect: Normal       Dilation    Both eyes: 1.0% Mydriacyl, 2.5% Phenylephrine @ 9:11 AM        Slit Lamp and Fundus Exam    External Exam      Right Left   External Normal  Normal       Slit Lamp Exam      Right Left   Lids/Lashes Normal Normal   Conjunctiva/Sclera White and quiet White and quiet   Cornea Clear Clear   Anterior Chamber Deep and quiet Deep and quiet   Iris Round and reactive Round and reactive   Lens Posterior chamber intraocular lens, Open posterior capsule Posterior chamber intraocular lens, Open posterior capsule   Anterior Vitreous Normal Normal       Fundus Exam      Right Left   Posterior Vitreous Central vitreous floaters, Posterior vitreous detachment Central vitreous floaters, Posterior vitreous detachment   Disc Normal Normal   C/D Ratio 0.25 0.25   Macula Normal Normal   Vessels no DR no DR   Periphery Normal Normal          IMAGING AND PROCEDURES  Imaging and Procedures for 10/16/20  OCT, Retina - OU - Both Eyes       Right Eye Quality was good. Scan locations included subfoveal. Central Foveal Thickness: 307. Progression has been stable. Findings include normal foveal contour.   Left Eye Quality was good. Scan locations included subfoveal. Central Foveal Thickness: 305. Progression has been stable. Findings include normal foveal contour.   Notes No active macular disease, no significant shadowing from central vitreous floaters currently.  We will continue to observe                ASSESSMENT/PLAN:  Diabetes mellitus without complication ([place]) The patient has diabetes without any evidence of retinopathy. The patient advised to maintain good blood glucose control, excellent blood pressure control, and favorable levels of cholesterol, low density lipoprotein, and high density lipoproteins. Follow up in 1 year was recommended. Explained that fluctuations in visual acuity , or "out of focus", may result from large variations of blood sugar control.      ICD-10-CM   1. Vitreous opacities of both eyes  H43.393   2. Posterior vitreous detachment of left eye  H43.812 OCT, Retina - OU - Both Eyes  3.  Posterior vitreous detachment of right eye  H43.811 OCT, Retina - OU - Both Eyes  4. Diabetes mellitus without complication ([place])  W09.8     1.  Patient continues to have some mild visual dysfunction from vitreous opacities but  they are not disabling at this time.  2.  No detectable diabetic retinopathy at this time.  3.  Ophthalmic Meds Ordered this visit:  No orders of the defined types were placed in this encounter.      Return in about 2 years (around 10/16/2022) for DILATE OU, COLOR FP, OCT.  There are no Patient Instructions on file for this visit.   Explained the diagnoses, plan, and follow up with the patient and they expressed understanding.  Patient expressed understanding of the importance of proper follow up care.   Clent Demark Vanna Shavers M.D. Diseases & Surgery of the Retina and Vitreous Retina & Diabetic [place] 10/16/20     Abbreviations: M myopia (nearsighted); A astigmatism; H hyperopia (farsighted); P presbyopia; Mrx spectacle prescription;  CTL contact lenses; OD right eye; OS left eye; OU both eyes  XT exotropia; ET esotropia; PEK punctate epithelial keratitis; PEE punctate epithelial erosions; DES dry eye syndrome; MGD meibomian gland dysfunction; ATs artificial tears; PFAT's preservative free artificial tears; [place] nuclear sclerotic cataract; PSC posterior subcapsular cataract; ERM epi-retinal membrane; PVD posterior vitreous detachment; RD retinal detachment; DM diabetes mellitus; DR diabetic retinopathy; NPDR non-proliferative diabetic retinopathy; PDR proliferative diabetic retinopathy; CSME clinically significant macular edema; DME diabetic macular edema; dbh dot blot hemorrhages; CWS cotton wool spot; POAG primary open angle glaucoma; C/D cup-to-disc ratio; HVF humphrey visual field; GVF goldmann visual field; OCT optical coherence tomography; IOP intraocular pressure; BRVO Branch retinal vein occlusion; CRVO central retinal vein occlusion; CRAO central retinal  artery occlusion; BRAO branch retinal artery occlusion; RT retinal tear; SB scleral buckle; PPV pars plana vitrectomy; VH Vitreous hemorrhage; PRP panretinal laser photocoagulation; IVK intravitreal kenalog; VMT vitreomacular traction; MH Macular hole;  NVD neovascularization of the disc; NVE neovascularization elsewhere; AREDS age related eye disease study; ARMD age related macular degeneration; POAG primary open angle glaucoma; EBMD epithelial/anterior basement membrane dystrophy; ACIOL anterior chamber intraocular lens; IOL intraocular lens; PCIOL posterior chamber intraocular lens; Phaco/IOL phacoemulsification with intraocular lens placement; [place] photorefractive keratectomy; LASIK laser assisted in situ keratomileusis; HTN hypertension; DM diabetes mellitus; COPD chronic obstructive pulmonary disease

## 2020-10-16 NOTE — Assessment & Plan Note (Signed)

## 2021-01-10 ENCOUNTER — Other Ambulatory Visit: Payer: Self-pay | Admitting: Gastroenterology

## 2021-01-10 DIAGNOSIS — R1011 Right upper quadrant pain: Secondary | ICD-10-CM

## 2021-01-11 ENCOUNTER — Ambulatory Visit
Admission: RE | Admit: 2021-01-11 | Discharge: 2021-01-11 | Disposition: A | Discharge status: Home / Self Care | Payer: Medicare Other | Source: Ambulatory Visit | Attending: Gastroenterology | Admitting: Gastroenterology

## 2021-01-11 DIAGNOSIS — R1011 Right upper quadrant pain: Secondary | ICD-10-CM

## 2022-07-10 ENCOUNTER — Other Ambulatory Visit: Payer: Self-pay | Admitting: Obstetrics and Gynecology

## 2022-07-10 DIAGNOSIS — Z1239 Encounter for other screening for malignant neoplasm of breast: Secondary | ICD-10-CM

## 2022-07-24 ENCOUNTER — Ambulatory Visit
Admission: RE | Admit: 2022-07-24 | Discharge: 2022-07-24 | Disposition: A | Discharge status: Home / Self Care | Payer: Medicare Other | Source: Ambulatory Visit | Attending: Obstetrics and Gynecology | Admitting: Obstetrics and Gynecology

## 2022-07-24 DIAGNOSIS — Z1239 Encounter for other screening for malignant neoplasm of breast: Secondary | ICD-10-CM

## 2022-07-24 MED ORDER — GADOPICLENOL 0.5 MMOL/ML IV SOLN
7.5000 mL | Freq: Once | INTRAVENOUS | Status: AC | PRN
  Administered 2022-07-24: 7.5 mL via INTRAVENOUS

## 2022-09-06 ENCOUNTER — Other Ambulatory Visit (HOSPITAL_COMMUNITY): Payer: Self-pay | Admitting: Gastroenterology

## 2022-09-06 ENCOUNTER — Other Ambulatory Visit: Payer: Self-pay | Admitting: Gastroenterology

## 2022-09-06 DIAGNOSIS — R1011 Right upper quadrant pain: Secondary | ICD-10-CM

## 2022-09-12 ENCOUNTER — Ambulatory Visit (HOSPITAL_COMMUNITY)
Admission: RE | Admit: 2022-09-12 | Discharge: 2022-09-12 | Disposition: A | Discharge status: Home / Self Care | Payer: Medicare Other | Source: Ambulatory Visit | Attending: Gastroenterology | Admitting: Gastroenterology

## 2022-09-12 ENCOUNTER — Other Ambulatory Visit (HOSPITAL_COMMUNITY): Payer: Self-pay | Admitting: Gastroenterology

## 2022-09-12 DIAGNOSIS — R1011 Right upper quadrant pain: Secondary | ICD-10-CM | POA: Diagnosis present

## 2022-09-12 LAB — POCT I-STAT CREATININE: Creatinine, Ser: 0.8 mg/dL (ref 0.44–1.00)

## 2022-09-12 MED ORDER — SODIUM CHLORIDE (PF) 0.9 % IJ SOLN
INTRAMUSCULAR | Status: AC
  Filled 2022-09-12: qty 50

## 2022-09-12 MED ORDER — IOHEXOL 300 MG/ML  SOLN
100.0000 mL | Freq: Once | INTRAMUSCULAR | Status: AC | PRN
  Administered 2022-09-12: 100 mL via INTRAVENOUS

## 2022-10-12 IMAGING — US US ABDOMEN COMPLETE
1 series · 13 of 25 positions shown · non-contrast
Comparison: None.

CLINICAL DATA: Right upper quadrant abdominal pain

EXAM:
ABDOMEN ULTRASOUND COMPLETE

[Series 1: us abdomen complete · 0.28mm/px · 13 of 89 slices shown]
[im 1/89]
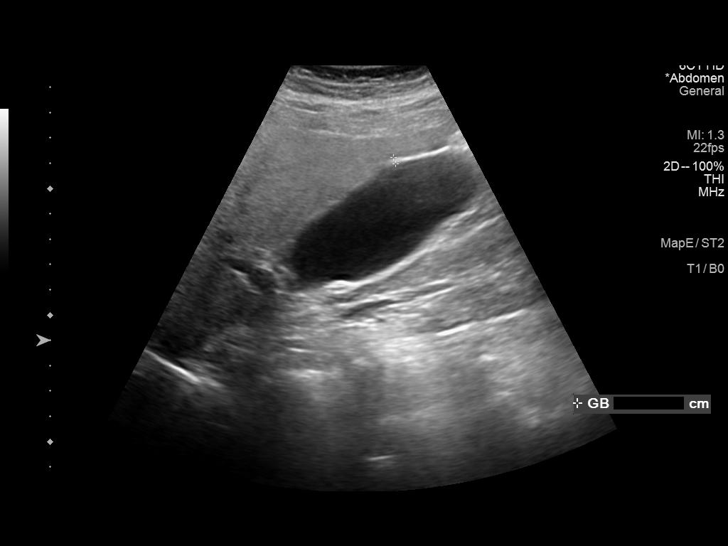
[im 8/89]
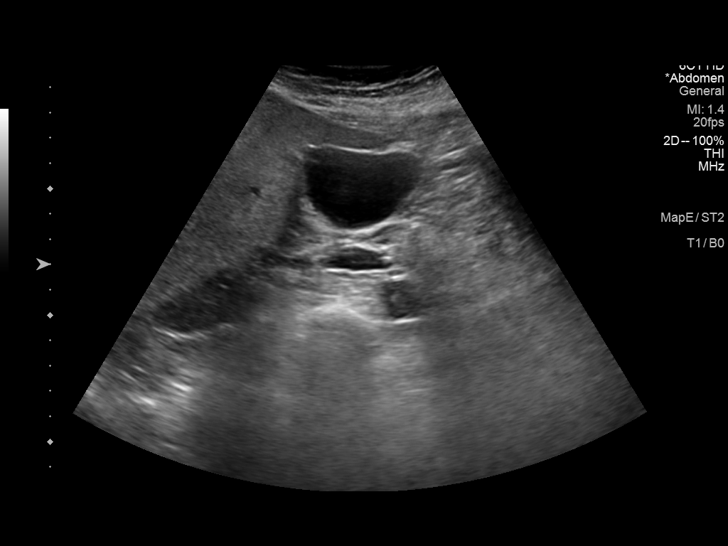
[im 15/89]
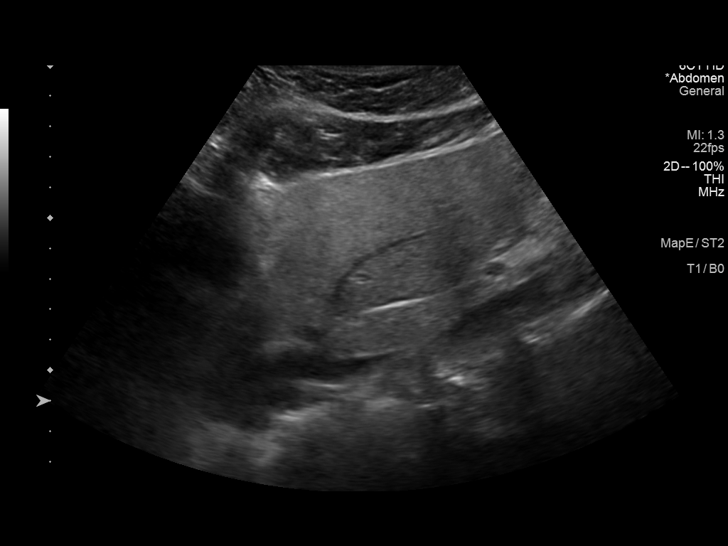
[im 23/89]
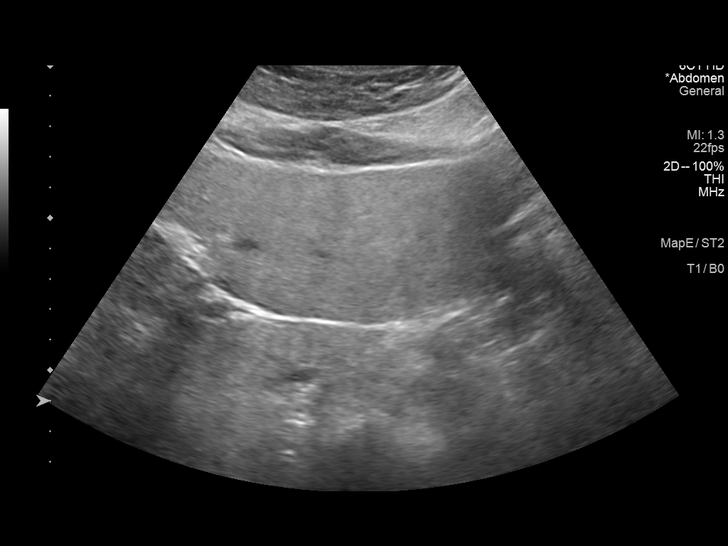
[im 30/89]
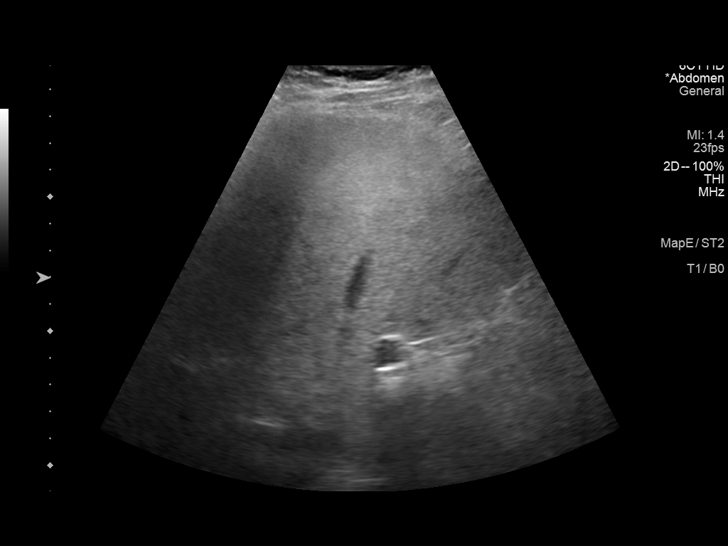
[im 37/89]
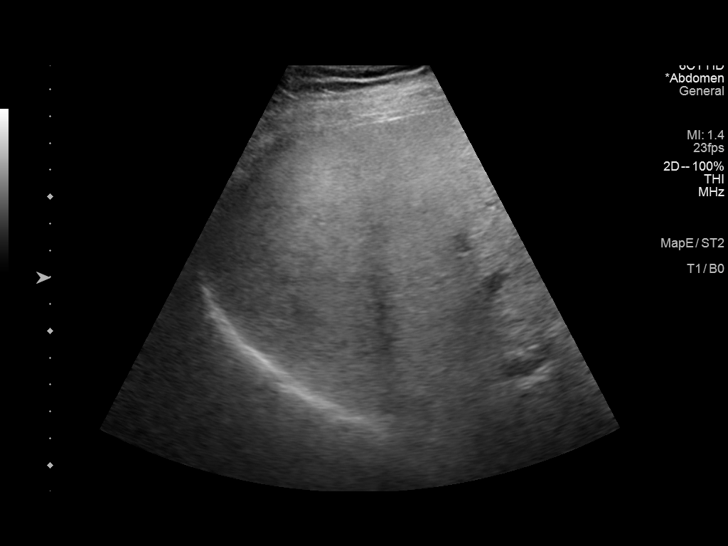
[im 45/89]
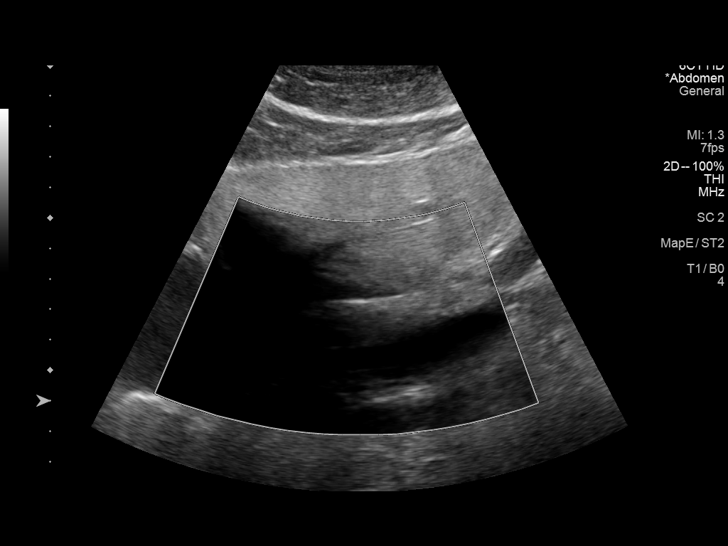
[im 52/89]
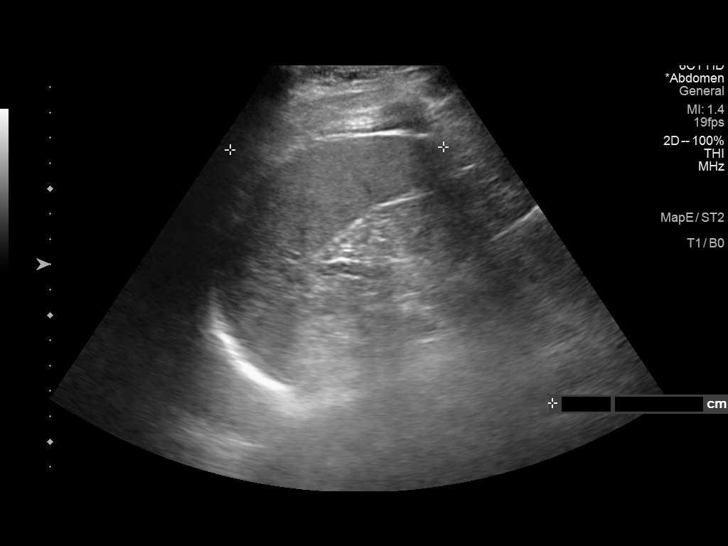
[im 59/89]
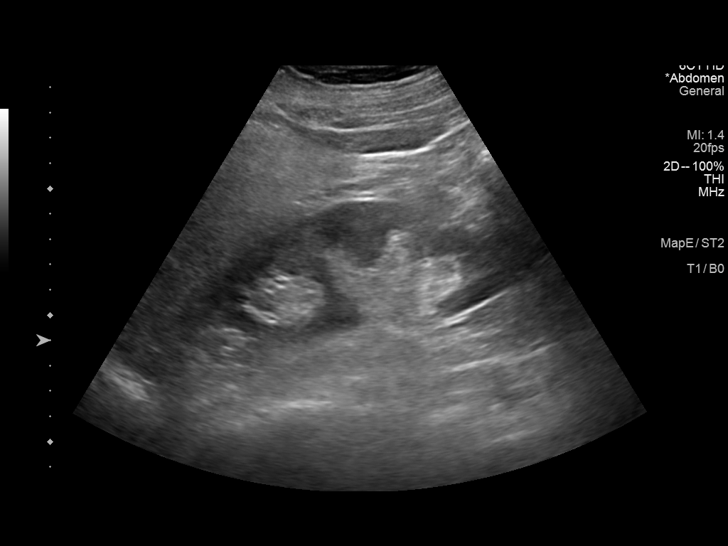
[im 67/89]
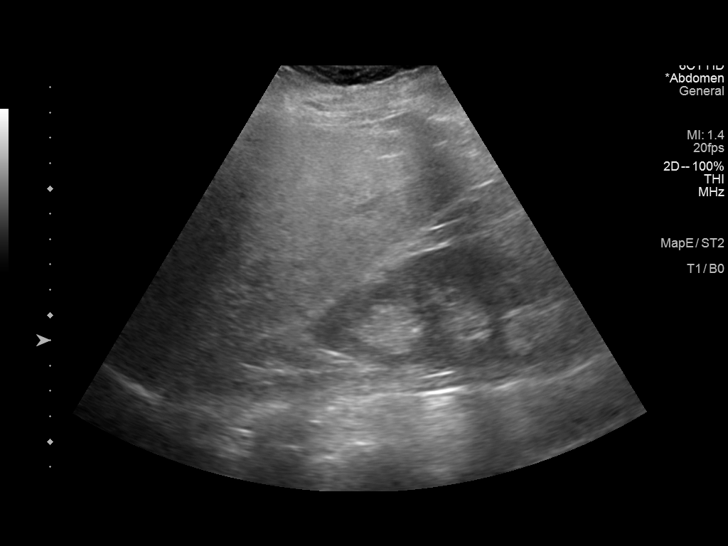
[im 74/89]
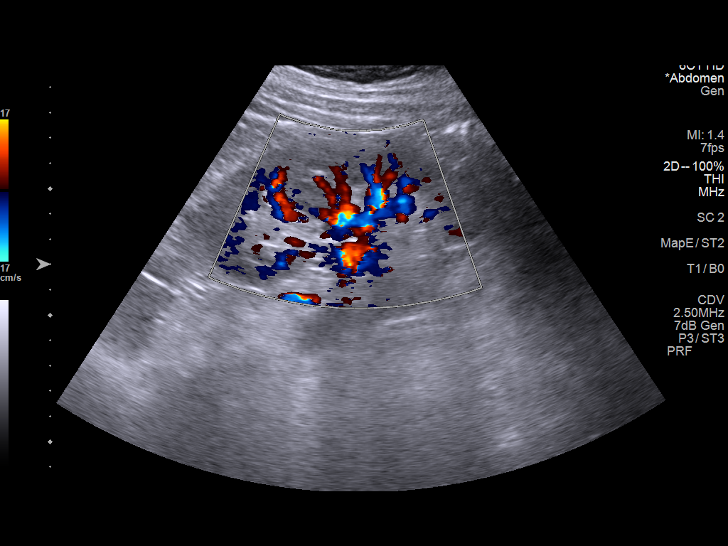
[im 81/89]
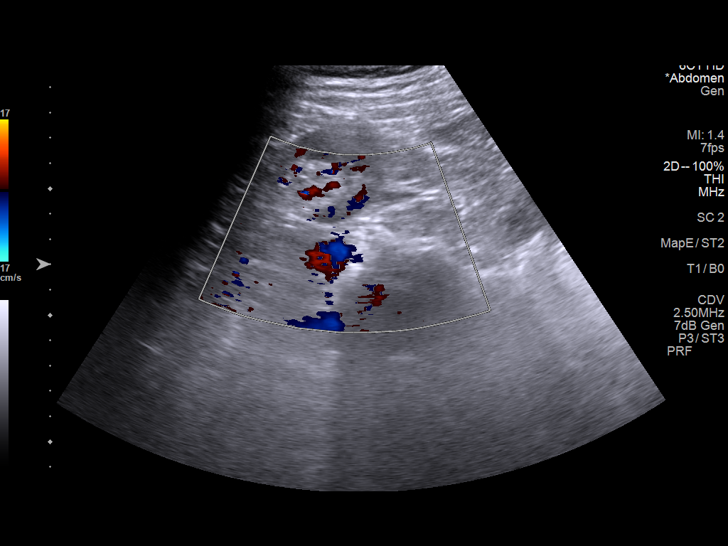
[im 89/89]
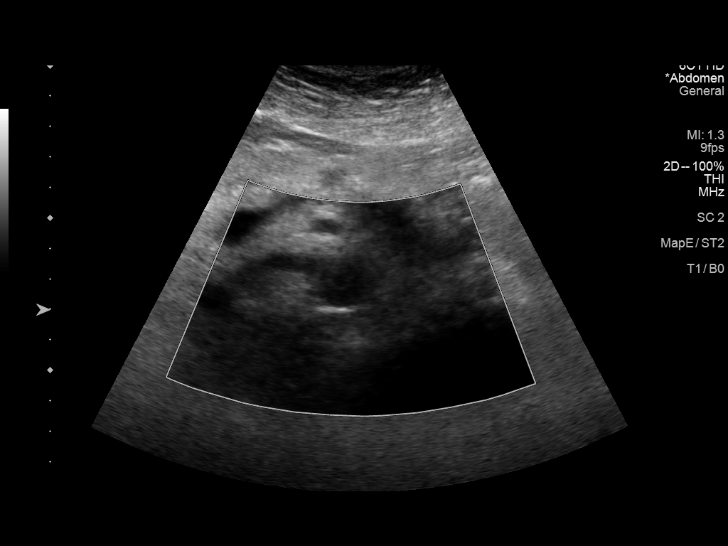

[13 of 25 positions shown; findings below may reference images not displayed]

FINDINGS: Gallbladder: No gallstones or wall thickening visualized. No
sonographic Murphy sign noted by sonographer.

Common bile duct: Diameter: 5 mm

Liver: Diffusely increased echogenicity of the hepatic parenchyma.
Small hypoechoic area along the gallbladder fossa, likely focal
fatty sparing. Portal vein is patent on color Doppler imaging with
normal direction of blood flow towards the liver.

IVC: No abnormality visualized.

Pancreas: Visualized portion unremarkable.

Spleen: Size and appearance within normal limits.

Right Kidney: Length: 12.2 cm. Echogenicity within normal limits. No
mass or hydronephrosis visualized.

Left Kidney: Length: 12.2 cm. Echogenicity within normal limits. No
mass or hydronephrosis visualized.

Abdominal aorta: Aortic atherosclerosis.  No aneurysm visualized.

Other findings: None.
IMPRESSION: 1. Unremarkable sonographic appearance of the gallbladder.
2. Diffusely increased echogenicity of the hepatic parenchyma
consistent with fatty infiltration. Probable focal fatty sparing
along the gallbladder fossa.
3. Aortic atherosclerosis.

## 2022-10-17 ENCOUNTER — Encounter (INDEPENDENT_AMBULATORY_CARE_PROVIDER_SITE_OTHER): Payer: TRICARE For Life (TFL) | Admitting: Ophthalmology

## 2023-01-14 ENCOUNTER — Other Ambulatory Visit: Payer: Self-pay | Admitting: Specialist

## 2023-01-14 DIAGNOSIS — M545 Low back pain, unspecified: Secondary | ICD-10-CM

## 2023-02-05 ENCOUNTER — Encounter: Payer: Self-pay | Admitting: Neurology

## 2023-02-17 ENCOUNTER — Other Ambulatory Visit: Payer: Medicare Other

## 2023-02-25 ENCOUNTER — Ambulatory Visit (INDEPENDENT_AMBULATORY_CARE_PROVIDER_SITE_OTHER): Payer: Medicare Other | Admitting: Neurology

## 2023-02-25 ENCOUNTER — Encounter: Payer: Self-pay | Admitting: Neurology

## 2023-02-25 VITALS — BP 109/64 | HR 100 | Ht 68.5 in | Wt 164.0 lb

## 2023-02-25 DIAGNOSIS — R208 Other disturbances of skin sensation: Secondary | ICD-10-CM | POA: Diagnosis not present

## 2023-02-25 NOTE — Patient Instructions (Signed)
If you would like to have further testing, your options are: Repeat nerve testing of the legs Check iron (ferritin) level Try higher dose of gabapentin 600mg 

## 2023-02-25 NOTE — Progress Notes (Signed)
[facility] Clinic Note - Initial Visit   Date: 02/25/2023   Karla Dyer MRN: [id number] DOB: [birth year]   Dear Dr. Shelle Iron:  Thank you for your kind referral of Karla Dyer for consultation of neuropathy. Although her history is well known to you, please allow [place] to reiterate it for the purpose of our medical record. The patient was accompanied to the clinic by self.     Karla Dyer is a 67 y.o. right-handed female with diabetes mellitus, hypertension, GERD, and hyperlipidemia presenting for evaluation of neuropathy.   IMPRESSION/PLAN: This is a 68 year-old female referred for evaluation of abnormal right leg sensation and abnormal NCS/EMG.  I have reviewed her EMG and would recommend repeating the study to also compare with the left leg for more comprehensive evaluation.  In the absence of abnormal needle EMG, findings may be technical or normal for patient's age.  No findings on her clinical exam or EMG to suggest motor neuron disease.   Right leg dysesthesias may be more suggestive of restless leg syndrome.  I suggested checking ferritin and offered to increase gabapentin to 600mg  at bedtime for a better trial of medication.  At this time, she is not keen on doing any further testing or trials of medication. My recommendations were provided to patient and she is welcome to contact me to schedule testing or start medication. All questions answered.  Return to clinic as needed  ------------------------------------------------------------- History of present illness: Over the past several years, she complains of muscle spasm in the right posterior leg.  She has been seeing Dr. Shelle Iron for this and had MRI lumbar spine which shows degenerative disc disease at L4-5 and L5-S1 moderately severe at L2-3.  There is lateral recess stenosis at L4-5 on the left.  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  Review of EMG show  suboptimal testing of there sensory and motor responses.   She tells me that she feels abnormal sensation on her right leg especially when she is laying down to rest.  She denies the urge to move the legs, but states that she does not have the feeling when she is active.  She does not have pain and she does not observe any muscle twitches. No numbness/tingling of the legs.  She has tried gabapentin 300mg  at bedtime with no improvement.   She does not want to do any more testing and tells me that she cancelled her CT myelogram which was ordered by Dr. Shelle Iron.   Out-side paper records, electronic medical record, and images have been reviewed where available and summarized as:  MRI lumbar spine 11/13/2022:  End-stage disc degeneration at L4-5 and L5-S1 moderately severe at L2-3.  There is lateral recess stenosis at L4-5 on the left.  NCS/EMG of the right leg performed by Dr. 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  Sensory response was normal.   Past Medical History:  Diagnosis Date   Asthma    Cancer 12/20/99   breast   Diabetes mellitus without complication    PONV (postoperative nausea and vomiting)     Past Surgical History:  Procedure Laterality Date   CESAREAN SECTION     ESOPHAGEAL MANOMETRY N/A 10/01/2017   Procedure: ESOPHAGEAL MANOMETRY (EM);  Surgeon: Charlott Rakes, MD;  Location: [facility];  Service: Endoscopy;  Laterality: N/A;   EYE SURGERY  OPEN REDUCTION INTERNAL FIXATION (ORIF) DISTAL RADIAL FRACTURE Right 04/26/2016   Procedure: OPEN REDUCTION INTERNAL FIXATION (ORIF) RIGHT WRIST DISTAL RADIAL FRACTURE;  Surgeon: Dominica Severin, MD;  Location: [facility];  Service: Orthopedics;  Laterality: Right;   WRIST SURGERY       Medications:  Outpatient Encounter Medications as of 02/25/2023  Medication Sig   acetaminophen (TYLENOL) 500 MG tablet  Take 500 mg by mouth every 6 (six) hours as needed.   albuterol (PROVENTIL HFA;VENTOLIN HFA) 108 (90 Base) MCG/ACT inhaler Inhale into the lungs every 6 (six) hours as needed for wheezing or shortness of breath.   Biotin 1 MG CAPS Take by mouth.   Cholecalciferol (D3 VITAMIN PO) Take by mouth. With K2   Cholecalciferol (VITAMIN D) 125 MCG (5000 UT) CAPS Take by mouth.   Coenzyme Q10 (CO Q 10) 100 MG CAPS Take by mouth.   cyanocobalamin (VITAMIN B12) 1000 MCG tablet Take 1,000 mcg by mouth daily.   empagliflozin (JARDIANCE) 25 MG TABS tablet Take 25 mg by mouth daily.   fluticasone (FLOVENT HFA) 44 MCG/ACT inhaler Inhale into the lungs.   folic acid (FOLATE) 400 MCG tablet Take 400 mcg by mouth daily.   Ginger, Zingiber officinalis, (GINGER PO) Take by mouth.   glucose blood (FREESTYLE LITE) test strip 1 each by Other route as needed for other. Use as instructed   Lancets 28G MISC by Does not apply route.   lisinopril (ZESTRIL) 5 MG tablet Take 5 mg by mouth once.   Magnesium 100 MG TABS Take 50 mg by mouth.   metFORMIN (GLUCOPHAGE-XR) 500 MG 24 hr tablet Take by mouth daily. Take two tablets daily with evening meal   Multiple Vitamins-Minerals (CENTRUM SILVER 50+WOMEN PO) Take by mouth.   NON FORMULARY Tumeric with Broperipe   NON FORMULARY [place]affron   Omega-3 Fatty Acids (OMEGA 3 500 PO) Take by mouth.   omeprazole (PRILOSEC) 40 MG capsule Take 40 mg by mouth daily.   pantoprazole (PROTONIX) 40 MG tablet 1 tablet Orally Once a day for 30 day(s)   pyridOXINE (VITAMIN B6) 25 MG tablet Take 25 mg by mouth daily.   rosuvastatin (CRESTOR) 10 MG tablet Take 10 mg by mouth daily.   vitamin C (VITAMIN C) 1000 MG tablet Take 1 tablet (1,000 mg total) by mouth daily.   [DISCONTINUED] cephALEXin (KEFLEX) 500 MG capsule Take 1 capsule (500 mg total) by mouth 4 (four) times daily.   [DISCONTINUED] docusate sodium (COLACE) 100 MG capsule Take 1 capsule (100 mg total) by mouth 2 (two) times daily.    [DISCONTINUED] HYDROmorphone (DILAUDID) 2 MG tablet Take 2 tablets (4 mg total) by mouth every 3 (three) hours as needed for moderate pain or severe pain (1-2 tablets PO PRN).   [DISCONTINUED] loratadine (CLARITIN) 10 MG tablet Take 10 mg by mouth daily.   [DISCONTINUED] methocarbamol (ROBAXIN) 500 MG tablet Take 500 mg by mouth every 8 (eight) hours as needed for muscle spasms.   [DISCONTINUED] promethazine (PHENERGAN) 12.5 MG tablet Take 1 tablet (12.5 mg total) by mouth every 6 (six) hours as needed for nausea.   No facility-administered encounter medications on file as of 02/25/2023.    Allergies: No Known Allergies  Family History: Family History  Problem Relation Age of Onset   Angina Mother    Appendicitis Mother    Cancer Mother        Vulva   Alzheimer's disease Mother    Kidney cancer Father    Skin  cancer Father    Melanoma Father    Dementia Father     Social History: Social History   Tobacco Use   Smoking status: Never   Smokeless tobacco: Never  Substance Use Topics   Alcohol use: No   Drug use: No   Social History   Social History Narrative   Right Handed    Lives in three story home     Vital Signs:  BP 109/64   Pulse 100   Ht 5' 8.5" (1.74 m)   Wt 164 lb (74.4 kg)   SpO2 95%   BMI 24.57 kg/m    Neurological Exam: MENTAL STATUS including orientation to time, place, person, recent and remote memory, attention span and concentration, language, and fund of knowledge is normal.  Speech is not dysarthric.  CRANIAL NERVES: II:  No visual field defects.     III-IV-VI: Pupils equal round and reactive to light.  Normal conjugate, extra-ocular eye movements in all directions of gaze.  No nystagmus.  No ptosis.   V:  Normal facial sensation.    VII:  Normal facial symmetry and movements.   VIII:  Normal hearing and vestibular function.   IX-X:  Normal palatal movement.   XI:  Normal shoulder shrug and head rotation.   XII:  Normal tongue strength and  range of motion, no deviation or fasciculation.  MOTOR:  No atrophy, fasciculations or abnormal movements.  No pronator drift.   Upper Extremity:  Right  Left  Deltoid  5/5   5/5   Biceps  5/5   5/5   Triceps  5/5   5/5   Wrist extensors  5/5   5/5   Wrist flexors  5/5   5/5   Finger extensors  5/5   5/5   Finger flexors  5/5   5/5   Dorsal interossei  5/5   5/5   Abductor pollicis  5/5   5/5   Tone (Ashworth scale)  0  0   Lower Extremity:  Right  Left  Hip flexors  5/5   5/5   Knee flexors  5/5   5/5   Knee extensors  5/5   5/5   Dorsiflexors  5/5   5/5   Plantarflexors  5/5   5/5   Toe extensors  5/5   5/5   Toe flexors  5/5   5/5   Tone (Ashworth scale)  0  0   MSRs:                                           Right        Left brachioradialis 2+  2+  biceps 2+  2+  triceps 2+  2+  patellar 2+  2+  ankle jerk 2+  2+  Hoffman no  no  plantar response down  down   SENSORY:  Normal and symmetric perception of light touch, pinprick, vibration, and proprioception.  Romberg's sign absent.   COORDINATION/GAIT: Normal finger-to- nose-finger.  Intact rapid alternating movements bilaterally.  Able to rise from a chair without using arms.  Gait narrow based and stable. Tandem and stressed gait intact.   Total time spent reviewing records, interview, history/exam, documentation, and coordination of care on day of encounter:  45 min    Thank you for allowing me to participate in patient's care.  If I can answer  any additional questions, I would be pleased to do so.    Sincerely,    Chenoah Mcnally K. Posey Pronto, DO

## 2023-09-18 ENCOUNTER — Telehealth: Payer: Self-pay | Admitting: Neurology

## 2023-09-18 ENCOUNTER — Other Ambulatory Visit: Payer: Self-pay

## 2023-09-18 DIAGNOSIS — R202 Paresthesia of skin: Secondary | ICD-10-CM

## 2023-09-18 NOTE — Telephone Encounter (Signed)
Noted  

## 2023-09-18 NOTE — Telephone Encounter (Signed)
Patient called to schedule her EMG with patel for her bil lower ext. She also scheduled a regular f/u appt. She stopped taking gabapentin 300mg  due to it making her woozy. She has been experiencing vertigo and gabapentin made it worse.

## 2023-10-10 ENCOUNTER — Ambulatory Visit (INDEPENDENT_AMBULATORY_CARE_PROVIDER_SITE_OTHER): Payer: Medicare Other | Admitting: Neurology

## 2023-10-10 DIAGNOSIS — R202 Paresthesia of skin: Secondary | ICD-10-CM | POA: Diagnosis not present

## 2023-10-10 NOTE — Procedures (Signed)
[facility]  [address] [place], [address]  [place], [place] [address] Tel: [phone] Fax: [phone] Test Date:  10/10/2023  Patient: Karla Dyer DOB: February 17, 1956 Physician: Nita Sickle, DO  Sex: Female Height: 5\' 8"  Ref Phys: Nita Sickle, DO  ID#: [id number]   Technician:    History: This is a 67 year-old female referred for evaluation of bilateral leg paresthesias.   NCV & EMG Findings: Extensive electrodiagnostic testing of the right lower extremity and additional studies of the left shows: Bilateral sural and superficial peroneal sensory responses are within normal limits. Bilateral peroneal and tibial motor responses are within normal limits. Bilateral tibial H reflex studies are within normal limits. There is no evidence of active or chronic motor axonal changes affecting any of the tested muscles.  Motor unit configuration and recruitment pattern is within normal limits.  Impression: This is a normal study of the lower extremities.  In particular, there is no evidence of a large fiber sensorimotor polyneuropathy or lumbosacral radiculopathy.    ___________________________ Nita Sickle, DO    Nerve Conduction Studies   Stim Site NR Peak (ms) Norm Peak (ms) O-P Amp (V) Norm O-P Amp  Left Sup Peroneal Anti Sensory (Ant Lat Mall)  32 C  12 cm    2.8 <4.6 6.4 >3  Right Sup Peroneal Anti Sensory (Ant Lat Mall)  32 C  12 cm    3.6 <4.6 7.8 >3  Left Sural Anti Sensory (Lat Mall)  32 C  Calf    4.2 <4.6 11.1 >3  Right Sural Anti Sensory (Lat Mall)  32 C  Calf    3.4 <4.6 12.4 >3     Stim Site NR Onset (ms) Norm Onset (ms) O-P Amp (mV) Norm O-P Amp Site1 Site2 Delta-0 (ms) Dist (cm) Vel (m/s) Norm Vel (m/s)  Left Peroneal Motor (Ext Dig Brev)  32 C  Ankle    5.2 <6.0 5.9 >2.5 B Fib Ankle 8.9 39.0 44 >40  B Fib    14.1  5.6  Poplt B Fib 1.7 8.0 47 >40  Poplt    15.8  5.3         Right Peroneal Motor (Ext Dig Brev)  32 C  Ankle    5.1 <6.0 3.9 >2.5  B Fib Ankle 9.3 41.0 44 >40  B Fib    14.4  2.9  Poplt B Fib 1.5 8.0 53 >40  Poplt    15.9  2.6         Left Tibial Motor (Abd Hall Brev)  32 C  Ankle    5.1 <6.0 5.2 >4 Knee Ankle 9.5 42.0 44 >40  Knee    14.6  4.2         Right Tibial Motor (Abd Hall Brev)  32 C  Ankle    3.8 <6.0 6.2 >4 Knee Ankle 10.0 43.0 43 >40  Knee    13.8  3.5          Electromyography   Side Muscle Ins.Act Fibs Fasc Recrt Amp Dur Poly Activation Comment  Right AntTibialis Nml Nml Nml Nml Nml Nml Nml Nml N/A  Right Gastroc Nml Nml Nml Nml Nml Nml Nml Nml N/A  Right Flex Dig Long Nml Nml Nml Nml Nml Nml Nml Nml N/A  Right RectFemoris Nml Nml Nml Nml Nml Nml Nml Nml N/A  Right BicepsFemS Nml Nml Nml Nml Nml Nml Nml Nml N/A  Right GluteusMed Nml Nml Nml Nml Nml Nml Nml Nml N/A  Left AntTibialis Nml Nml Nml Nml Nml Nml Nml Nml N/A  Left Gastroc Nml Nml Nml Nml Nml Nml Nml Nml N/A  Left Flex Dig Long Nml Nml Nml Nml Nml Nml Nml Nml N/A  Left RectFemoris Nml Nml Nml Nml Nml Nml Nml Nml N/A  Left GluteusMed Nml Nml Nml Nml Nml Nml Nml Nml N/A      Waveforms:

## 2023-10-23 ENCOUNTER — Encounter: Payer: Medicare Other | Admitting: Neurology

## 2023-12-23 ENCOUNTER — Ambulatory Visit (INDEPENDENT_AMBULATORY_CARE_PROVIDER_SITE_OTHER): Payer: Medicare Other | Admitting: Neurology

## 2023-12-23 VITALS — BP 110/74 | HR 94 | Ht 68.5 in | Wt 162.0 lb

## 2023-12-23 DIAGNOSIS — M5416 Radiculopathy, lumbar region: Secondary | ICD-10-CM | POA: Diagnosis not present

## 2023-12-23 DIAGNOSIS — R202 Paresthesia of skin: Secondary | ICD-10-CM

## 2023-12-23 NOTE — Progress Notes (Signed)
 Follow-up Visit   Date: 12/23/2023    Karla Dyer MRN: [id number] DOB: 1955-12-14    Karla Dyer is a 68 y.o. right-handed Caucasian female with iabetes mellitus, hypertension, GERD, and hyperlipidemia returning to the clinic for follow-up of right leg paresthesias.  The patient was accompanied to the clinic by self.  IMPRESSION/PLAN: Right leg radicular pain and paresthesias, possible dynamic nerve impingement, as there is no focal foraminal stenosis on the right.  There is no evidence of neuropathy on repeat NCS/EMG.  Discussed management options of physical therapy, medication, or ESI, which she has tried in the past.  She does not wish to take gabapentin and has stopped it.  I explained that some patient with chronic pain will develop coping strategies, which is what she has done.  I do not have anything further to offer her.  She is welcome to return to clinic as needed.    --------------------------------------------- History of present illness: Over the past several years, she complains of muscle spasm in the right posterior leg.  She has been seeing Dr. Duwayne for this and had MRI lumbar spine which shows degenerative disc disease at L4-5 and L5-S1 moderately severe at L2-3.  There is lateral recess stenosis at L4-5 on the left.  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  Review of EMG show suboptimal testing of there sensory and motor responses.   She tells me that she feels abnormal sensation on her right leg especially when she is laying down to rest.  She denies the urge to move the legs, but states that she does not have the feeling when she is active.  She does not have pain and she does not observe any muscle twitches. No numbness/tingling of the legs.  She has tried gabapentin 300mg  at bedtime with no improvement.   She does not want to do any more testing and tells me that she cancelled her CT myelogram which was ordered by Dr. Duwayne.    UPDATE 12/23/2023:  She is here for follow-up visit.  She had repeat NCS/EMG of the legs which was normal.  She continues to have tingling down the right leg.  She also complains of back pain and hip pain.  She has tried PT and injections.  She does not want to take gabapentin or medication to mask the pain.   No leg weakness.  She walks unassisted.   Medications:  Current Outpatient Medications on File Prior to Visit  Medication Sig Dispense Refill   acetaminophen  (TYLENOL ) 500 MG tablet Take 500 mg by mouth every 6 (six) hours as needed.     Cholecalciferol (D3 VITAMIN PO) Take by mouth. With K2     Cholecalciferol (VITAMIN D) 125 MCG (5000 UT) CAPS Take by mouth.     cyanocobalamin (VITAMIN B12) 1000 MCG tablet Take 1,000 mcg by mouth daily.     empagliflozin (JARDIANCE) 25 MG TABS tablet Take 25 mg by mouth daily.     fluticasone (FLOVENT HFA) 44 MCG/ACT inhaler Inhale into the lungs.     glucose blood (FREESTYLE LITE) test strip 1 each by Other route as needed for other. Use as instructed     Lancets 28G MISC by Does not apply route.     lisinopril (ZESTRIL) 5 MG tablet Take 5 mg by mouth once.     metFORMIN (GLUCOPHAGE-XR) 500 MG 24 hr tablet Take by mouth daily. Take two tablets daily with evening meal  Multiple Vitamins-Minerals (CENTRUM SILVER 50+WOMEN PO) Take by mouth.     omeprazole (PRILOSEC) 40 MG capsule Take 40 mg by mouth daily.     pantoprazole (PROTONIX) 40 MG tablet 1 tablet Orally Once a day for 30 day(s)     pyridOXINE (VITAMIN B6) 25 MG tablet Take 25 mg by mouth daily.     rosuvastatin (CRESTOR) 10 MG tablet Take 10 mg by mouth daily.     vitamin C  (VITAMIN C ) 1000 MG tablet Take 1 tablet (1,000 mg total) by mouth daily. 120 tablet 1   No current facility-administered medications on file prior to visit.    Allergies: No Known Allergies  Vital Signs:  BP 110/74   Pulse 94   Ht 5' 8.5 (1.74 m)   Wt 162 lb (73.5 kg)   SpO2 96%   BMI 24.27 kg/m     Neurological Exam: MENTAL STATUS including orientation to time, place, person, recent and remote memory, attention span and concentration, language, and fund of knowledge is normal.  Speech is not dysarthric.  CRANIAL NERVES:    Pupils equal round and reactive to light.  Normal conjugate, extra-ocular eye movements in all directions of gaze.  No ptosis.  Face is symmetric.   MOTOR:  Motor strength is 5/5 in all extremities.  No atrophy, fasciculations or abnormal movements.  No pronator drift.  Tone is normal.    MSRs:  Reflexes are 2+/4 throughout.  SENSORY:  Intact to vibration throughout.  COORDINATION/GAIT:   Intact rapid alternating movements bilaterally.  Gait narrow based and stable.   Data: NCS/EMG of the legs 10/10/2023: This is a normal study of the lower extremities.  In particular, there is no evidence of a large fiber sensorimotor polyneuropathy or lumbosacral radiculopathy.   MRI lumbar spine 11/13/2022: End-stage disc degeneration at L4-5 and L5-S1 moderately severe at L2-3. There is lateral recess stenosis at L4-5 on the left.    Thank you for allowing me to participate in patient's care.  If I can answer any additional questions, I would be pleased to do so.    Sincerely,    Julizza Sassone K. Tobie, DO

## 2023-12-29 ENCOUNTER — Other Ambulatory Visit: Payer: Self-pay | Admitting: Obstetrics and Gynecology

## 2023-12-29 DIAGNOSIS — Z1239 Encounter for other screening for malignant neoplasm of breast: Secondary | ICD-10-CM

## 2024-02-19 ENCOUNTER — Other Ambulatory Visit: Payer: Self-pay | Admitting: Otolaryngology

## 2024-02-19 DIAGNOSIS — J329 Chronic sinusitis, unspecified: Secondary | ICD-10-CM

## 2024-02-19 DIAGNOSIS — R42 Dizziness and giddiness: Secondary | ICD-10-CM

## 2024-03-21 ENCOUNTER — Ambulatory Visit
Admission: RE | Admit: 2024-03-21 | Discharge: 2024-03-21 | Disposition: A | Discharge status: Home / Self Care | Source: Ambulatory Visit | Attending: Otolaryngology | Admitting: Otolaryngology

## 2024-03-21 DIAGNOSIS — J329 Chronic sinusitis, unspecified: Secondary | ICD-10-CM

## 2024-03-21 DIAGNOSIS — R42 Dizziness and giddiness: Secondary | ICD-10-CM

## 2024-03-21 MED ORDER — GADOPICLENOL 0.5 MMOL/ML IV SOLN
7.5000 mL | Freq: Once | INTRAVENOUS | Status: AC | PRN
  Administered 2024-03-21: 7.5 mL via INTRAVENOUS

## 2024-05-11 ENCOUNTER — Other Ambulatory Visit: Payer: Self-pay

## 2024-05-11 ENCOUNTER — Ambulatory Visit (INDEPENDENT_AMBULATORY_CARE_PROVIDER_SITE_OTHER): Admitting: Allergy & Immunology

## 2024-05-11 ENCOUNTER — Encounter: Payer: Self-pay | Admitting: Allergy & Immunology

## 2024-05-11 VITALS — BP 120/80 | HR 73 | Temp 98.4°F | Resp 18 | Ht 68.11 in | Wt 159.2 lb

## 2024-05-11 DIAGNOSIS — G8929 Other chronic pain: Secondary | ICD-10-CM

## 2024-05-11 DIAGNOSIS — M549 Dorsalgia, unspecified: Secondary | ICD-10-CM | POA: Diagnosis not present

## 2024-05-11 DIAGNOSIS — J310 Chronic rhinitis: Secondary | ICD-10-CM | POA: Diagnosis not present

## 2024-05-11 DIAGNOSIS — J4540 Moderate persistent asthma, uncomplicated: Secondary | ICD-10-CM

## 2024-05-11 NOTE — Patient Instructions (Signed)
 Chronic rhinitis - Because of insurance stipulations, we cannot do skin testing on the same day as your first visit. - We are all working to fight this, but for now we need to do two separate visits.  - We will know more after we do testing at the next visit.  - The skin testing visit can be squeezed in at your convenience.  - Then we can make a more full plan to address all of your symptoms. - Be sure to stop your antihistamines for 3 days before this appointment.   2. Moderate persistent asthma, uncomplicated  - Lung testing looks decent today.  - We are going to change you to Breztri (contains THREE medications to help with your breathing). - This will replace the Advair (purple inhaler).  - I hope that this will help with your symptoms, but pay attention and we will chat more next time!  - Sample provided today.  - Spacer use reviewed. - Daily controller medication(s): Breztri 160/9/4.86mcg two puffs twice daily - Prior to physical activity: albuterol 2 puffs 10-15 minutes before physical activity. - Rescue medications: albuterol 4 puffs every 4-6 hours as needed - Asthma control goals:  * Full participation in all desired activities (may need albuterol before activity) * Albuterol use two time or less a week on average (not counting use with activity) * Cough interfering with sleep two time or less a month * Oral steroids no more than once a year * No hospitalizations  3. Return in about 1 week (around 05/18/2024) for SKIN TESTING (1-55). You can have the follow up appointment with Dr. Iva or a Nurse Practicioner (our Nurse Practitioners are excellent and always have Physician oversight!).    Please inform us  of any Emergency Department visits, hospitalizations, or changes in symptoms. Call us  before going to the ED for breathing or allergy symptoms since we might be able to fit you in for a sick visit. Feel free to contact us  anytime with any questions, problems, or  concerns.  It was a pleasure to meet you today!  Websites that have reliable patient information: 1. American Academy of Asthma, Allergy, and Immunology: www.aaaai.org 2. Food Allergy Research and Education (FARE): foodallergy.org 3. Mothers of Asthmatics: http://www.asthmacommunitynetwork.org 4. American College of Allergy, Asthma, and Immunology: www.acaai.org      "Like" us  on Facebook and Instagram for our latest updates!      A healthy democracy works best when [employer] participate! Make sure you are registered to vote! If you have moved or changed any of your contact information, you will need to get this updated before voting! Scan the QR codes below to learn more!           EXAMINATION: MR BRAIN W WO CONTRAST   HISTORY: Headache, right-sided weakness   TECHNIQUE: MRI of the brain performed with and without IV contrast.   CONTRAST: IV   COMPARISON: 05/10/2016   FINDINGS:   No evidence for intracranial mass, hemorrhage, or acute infarct. There are mild bilateral periventricular and subcortical white matter T2 hyperintensities, which are nonspecific, but most likely secondary to chronic microvascular ischemia. The ventricles are symmetric and the basilar cisterns are patent.   There is mild paranasal sinus disease. The mastoid air cells are well aerated. The orbits appear normal.   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 Otherwise no abnormal enhancement is identified following the administration of intravenous contrast material.   IMPRESSION:   No acute intracranial findings.  Mild chronic microvascular ischemic changes.   Electronically signed by: Italy Engel MD 03/28/2024 03:50 PM EDT RP Workstation: [id number]

## 2024-05-11 NOTE — Progress Notes (Signed)
 NEW PATIENT  Date of Service/Encounter:  05/11/24  Consult requested by: Waylan Almarie SAUNDERS, MD   Assessment:   Chronic bilateral back pain  Chronic rhinitis - planning for skin testing at the next visit  Moderate persistent asthma, uncomplicated  Type 2 diabetes - sees Endocrinology  Paresthesias - sees Neurology  History of cancer - 10+ years ago  Plan/Recommendations:   Chronic rhinitis - Because of insurance stipulations, we cannot do skin testing on the same day as your first visit. - We are all working to fight this, but for now we need to do two separate visits.  - We will know more after we do testing at the next visit.  - The skin testing visit can be squeezed in at your convenience.  - Then we can make a more full plan to address all of your symptoms. - Be sure to stop your antihistamines for 3 days before this appointment.   2. Moderate persistent asthma, uncomplicated  - Lung testing looks decent today.  - We are going to change you to Breztri (contains THREE medications to help with your breathing). - This will replace the Advair (purple inhaler).  - I hope that this will help with your symptoms, but pay attention and we will chat more next time!  - Sample provided today.  - Spacer use reviewed. - Daily controller medication(s): Breztri 160/9/4.14mcg two puffs twice daily - Prior to physical activity: albuterol 2 puffs 10-15 minutes before physical activity. - Rescue medications: albuterol 4 puffs every 4-6 hours as needed - Asthma control goals:  * Full participation in all desired activities (may need albuterol before activity) * Albuterol use two time or less a week on average (not counting use with activity) * Cough interfering with sleep two time or less a month * Oral steroids no more than once a year * No hospitalizations  3. Return in about 1 week (around 05/18/2024) for SKIN TESTING (1-55). You can have the follow up appointment with Dr.  Iva or a Nurse Practicioner (our Nurse Practitioners are excellent and always have Physician oversight!).    This note in its entirety was forwarded to the Provider who requested this consultation.  Subjective:   Karla Dyer is a 68 y.o. female presenting today for evaluation of  Chief Complaint  Patient presents with   Allergy Testing    Referred by dr. Carlie, asthma   Asthma    Jameica Couts has a history of the following: Patient Active Problem List   Diagnosis Date Noted   Vitreous opacities of both eyes 10/16/2020   Posterior vitreous detachment of left eye 10/16/2020   Posterior vitreous detachment of right eye 10/16/2020   Diabetes mellitus without complication (HCC) 10/16/2020   Distal radius fracture, right 04/26/2016    History obtained from: chart review and patient.  Discussed the use of AI scribe software for clinical note transcription with the patient and/or guardian, who gave verbal consent to proceed.  Tylena Prisk was referred by Waylan Almarie SAUNDERS, MD.     Myiah is a 68 y.o. female presenting for an evaluation of environmental allergies and asthma.   Asthma/Respiratory Symptom History: She experiences ongoing respiratory symptoms, including difficulty breathing and asthma exacerbations, particularly in [place] , which she describes as the worst state for her allergies. She uses Advair but finds it ineffective and has requested stronger medication. She experiences nighttime breathing difficulties and suspects her mitral valve issues may contribute to her symptoms. She has a history  of asthma since childhood.  Allergic Rhinitis Symptom History: She describes significant sinus issues, including severe congestion and the need to use a nasal cleaner spray, which results in the expulsion of 'giant, green, hard, contact lens shaped' material. She has a history of 'pneumonic sinusitis' since age 79 and recently underwent an MRI of her head, which showed mild  paranasal sinus disease. She was previously told she had a missing sinus spot, but recent imaging did not confirm this.  Her past medical history includes a deteriorated lower spine, bilateral rotator cuff tears, and a history of breast cancer treated with radioactive isotopes. She has experienced multiple injuries from falls and accidents, contributing to her musculoskeletal issues. She has had wrist surgery and describes significant pain from her rotator cuff repair.  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 She notes that since wearing masks during COVID-19, she has not been sick.   Otherwise, there is no history of other atopic diseases, including food allergies, drug allergies, stinging insect allergies, or contact dermatitis. There is no significant infectious history. Vaccinations are up to date.    Past Medical History: Patient Active Problem List   Diagnosis Date Noted   Vitreous opacities of both eyes 10/16/2020   Posterior vitreous detachment of left eye 10/16/2020   Posterior vitreous detachment of right eye 10/16/2020   Diabetes mellitus without complication (HCC) 10/16/2020   Distal radius fracture, right 04/26/2016    Medication List:  Allergies as of 05/11/2024   No Known Allergies      Medication List        Accurate as of May 11, 2024  1:43 PM. If you have any questions, ask your nurse or doctor.          acetaminophen  500 MG tablet Commonly known as: TYLENOL  Take 500 mg by mouth every 6 (six) hours as needed.   ascorbic acid  1000 MG tablet Commonly known as: VITAMIN C  Take 1 tablet (1,000 mg total) by mouth daily.   CENTRUM SILVER 50+WOMEN PO Take by mouth.   cyanocobalamin 1000 MCG tablet Commonly known as: VITAMIN B12 Take 1,000 mcg by mouth daily.   fluticasone 44 MCG/ACT inhaler Commonly known as: FLOVENT HFA Inhale into the lungs.   FREESTYLE LITE  test strip Generic drug: glucose blood 1 each by Other route as needed for other. Use as instructed   Jardiance 25 MG Tabs tablet Generic drug: empagliflozin Take 25 mg by mouth daily.   Lancets 28G Misc by Does not apply route.   lisinopril 5 MG tablet Commonly known as: ZESTRIL Take 5 mg by mouth once.   metFORMIN 500 MG 24 hr tablet Commonly known as: GLUCOPHAGE-XR Take by mouth daily. Take two tablets daily with evening meal   omeprazole 40 MG capsule Commonly known as: PRILOSEC Take 40 mg by mouth daily.   pantoprazole 40 MG tablet Commonly known as: PROTONIX 1 tablet Orally Once a day for 30 day(s)   pyridOXINE 25 MG tablet Commonly known as: VITAMIN B6 Take 25 mg by mouth daily.   rosuvastatin 10 MG tablet Commonly known as: CRESTOR Take 10 mg by mouth daily.   Vitamin D 125 MCG (5000 UT) Caps Take by mouth.   D3 VITAMIN PO Take by mouth. With K2        Birth History: non-contributory  Developmental History: non-contributory  Past Surgical History: Past Surgical History:  Procedure Laterality Date   CESAREAN SECTION  ESOPHAGEAL MANOMETRY N/A 10/01/2017   Procedure: ESOPHAGEAL MANOMETRY (EM);  Surgeon: Dianna Specking, MD;  Location: [facility];  Service: Endoscopy;  Laterality: N/A;   EYE SURGERY     OPEN REDUCTION INTERNAL FIXATION (ORIF) DISTAL RADIAL FRACTURE Right 04/26/2016   Procedure: OPEN REDUCTION INTERNAL FIXATION (ORIF) RIGHT WRIST DISTAL RADIAL FRACTURE;  Surgeon: Elsie Mussel, MD;  Location: [facility];  Service: Orthopedics;  Laterality: Right;   WRIST SURGERY       Family History: Family History  Problem Relation Age of Onset   Angina Mother    Appendicitis Mother    Cancer Mother        Vulva   Alzheimer's disease Mother    Kidney cancer Father    Skin cancer Father    Melanoma Father    Dementia Father      Social History: Logyn lives at home in a house that was built in 1956. There are wood  floors and tiles th gets look in his sinus symptoms today having congestion and runny nose roughout the home. There is wood flooring in the bedroom. There is gas heating and central cooling with heat pump. There are no animals inside or outside of the home. There is no tobacco exposure in the home. There are dust mite coverings on the bed and the pillows. She is a retired Runner, broadcasting/film/video. Her ex husband was in the [employer].  She taught art and special education.   Review of systems otherwise negative other than that mentioned in the HPI.    Objective:   Blood pressure 120/80, pulse 73, temperature 98.4 F (36.9 C), temperature source Temporal, resp. rate 18, height 5' 8.11 (1.73 m), weight 159 lb 3.2 oz (72.2 kg), SpO2 98%. Body mass index is 24.13 kg/m.     Physical Exam Vitals reviewed.  Constitutional:      Appearance: She is well-developed.     Comments: Very talkative.  HENT:     Head: Normocephalic and atraumatic.     Right Ear: Tympanic membrane, ear canal and external ear normal. No drainage, swelling or tenderness. Tympanic membrane is not injected, scarred, erythematous, retracted or bulging.     Left Ear: Tympanic membrane, ear canal and external ear normal. No drainage, swelling or tenderness. Tympanic membrane is not injected, scarred, erythematous, retracted or bulging.     Nose: No nasal deformity, septal deviation, mucosal edema or rhinorrhea.     Right Turbinates: Enlarged, swollen and pale.     Left Turbinates: Enlarged, swollen and pale.     Right Sinus: No maxillary sinus tenderness or frontal sinus tenderness.     Left Sinus: No maxillary sinus tenderness or frontal sinus tenderness.     Mouth/Throat:     Mouth: Mucous membranes are not pale and not dry.     Pharynx: Uvula midline.   Eyes:     General:        Right eye: No discharge.        Left eye: No discharge.     Conjunctiva/sclera: Conjunctivae normal.     Right eye: Right conjunctiva is not injected. No  chemosis.    Left eye: Left conjunctiva is not injected. No chemosis.    Pupils: Pupils are equal, round, and reactive to light.    Cardiovascular:     Rate and Rhythm: Normal rate and regular rhythm.     Heart sounds: Normal heart sounds.  Pulmonary:     Effort: Pulmonary effort is normal. No tachypnea, accessory  muscle usage or respiratory distress.     Breath sounds: Normal breath sounds. No wheezing, rhonchi or rales.     Comments: Moving air well in all lung fields. No increased work of breathing noted.  Chest:     Chest wall: No tenderness.  Abdominal:     Tenderness: There is no abdominal tenderness. There is no guarding or rebound.  Lymphadenopathy:     Head:     Right side of head: No submandibular, tonsillar or occipital adenopathy.     Left side of head: No submandibular, tonsillar or occipital adenopathy.     Cervical: No cervical adenopathy.   Skin:    Coloration: Skin is not pale.     Findings: No abrasion, erythema, petechiae or rash. Rash is not papular, urticarial or vesicular.   Neurological:     Mental Status: She is alert.   Psychiatric:        Behavior: Behavior is cooperative.      Diagnostic studies:    Spirometry: results normal (FEV1: 2.17/87%, FVC: 3.00/92%, FEV1/FVC: 72%).    Spirometry consistent with normal pattern.    Allergy Studies: none        Marty Shaggy, MD [facility] 

## 2024-05-18 NOTE — Progress Notes (Unsigned)
 Skin testing note  RE: Karla Dyer MRN: [id number] DOB: [birth year] Date of Office Visit: 05/19/2024  Referring provider: Waylan Almarie SAUNDERS, MD Primary care provider: Waylan Almarie SAUNDERS, MD  Chief Complaint: skin testing  History of Present Illness: I had the pleasure of seeing Karla Dyer for a skin testing visit at the [facility] on 05/19/2024. She is a 68 y.o. female, who is being followed for chronic rhinitis and asthma. Her previous allergy office visit was on 05/11/2024 with Dr. Iva. Today is a skin testing visit.   Discussed the use of AI scribe software for clinical note transcription with the patient, who gave verbal consent to proceed.    She has experienced a significant increase in allergic reactions, now including pollen, trees, grass, weed, ragweed, mold, dust mites, cat, horse, and cockroach. Previously, she was only allergic to cockroach and shellfish. She attributes the worsening of her allergies to her relocation to [place]. Symptoms include frequent sneezing and she has been wearing a double mask when going outside to mitigate symptoms.  She has stopped taking her antihistamines, nasal spray, eye drops, asthma medication, and vitamins in preparation for allergy testing. She has not yet started prednisone for her spine, fearing it might affect her allergy test results. Her current medications include montelukast for allergies, and she has previously used Flonase.   Her eyes are affected when not using eye drops, and she has a skin condition on her arms and legs, diagnosed by her dermatologist as contact dermatitis. She notes a family history of similar symptoms, mentioning her father.  She is considering moving to [place] due to her allergies and is in the process of selling her house in [place].     Assessment and Plan: Melena is a 68 y.o. female with: Other allergic rhinitis Today's skin testing positive to grass, ragweed, weed, trees,  mold, dust mites, horse, cockroach and tobacco leaf. Start environmental control measures as below. Use over the counter antihistamines such as Zyrtec (cetirizine), Claritin (loratadine), Allegra (fexofenadine), or Xyzal (levocetirizine) daily as needed. May switch antihistamines every few months. Continue Singulair (montelukast) 10mg  daily at night. Use Nasacort (triamcinolone) nasal spray 1-2 sprays per nostril once a day as needed for nasal congestion.  Use azelastine nasal spray 1-2 sprays per nostril twice a day as needed for runny nose/drainage. Nasal saline spray (i.e., Simply Saline) or nasal saline lavage (i.e., NeilMed) is recommended as needed and prior to medicated nasal sprays. Consider allergy injections for long term control if above medications do not help the symptoms - handout given.  2 injections.   Moderate persistent asthma, uncomplicated - Daily controller medication(s): Breztri 160/9/4.43mcg two puffs twice daily with spacer and rinse mouth afterwards. - Prior to physical activity: albuterol 2 puffs 10-15 minutes before physical activity. - Rescue medications: albuterol 4 puffs every 4-6 hours as needed   Return in about 3 months (around 08/19/2024).  Meds ordered this encounter  Medications   triamcinolone (NASACORT) 55 MCG/ACT AERO nasal inhaler    Sig: Place 1-2 sprays into the nose daily as needed (nasal congestion).    Dispense:  1 each    Refill:  3   azelastine (ASTELIN) 0.1 % nasal spray    Sig: Place 1-2 sprays into both nostrils 2 (two) times daily as needed (nasal drainage). Use in each nostril as directed    Dispense:  30 mL    Refill:  3   Lab Orders  No laboratory test(s) ordered today  Diagnostics: Skin Testing: Environmental allergy panel. Today's skin testing positive to grass, ragweed, weed, trees, mold, dust mites, horse, cockroach and tobacco leaf. Results discussed with patient/family.  Airborne Adult Perc - 05/19/24 1322     Time  Antigen Placed 1322    Allergen Manufacturer Jestine    Location Back    Number of Test 55    1. Control-Buffer 50% Glycerol Negative    2. Control-Histamine 3+    3. Bahia Negative    4. [place] Negative    5. Johnson 3+    6. [place]  Blue Negative    7. Meadow Fescue Negative    8. Perennial Rye 2+    9. Timothy 2+    10. Ragweed Mix 2+    11. Cocklebur Negative    12. Plantain,  English 2+    13. Baccharis 2+    14. Dog Fennel 3+    15. Russian Thistle Negative    16. Lamb's Quarters 2+    17. Sheep Sorrell 2+    18. Rough Pigweed 3+    19. Issac Elder, Rough --   +/-   20. Mugwort, Common Negative    21. Box, Elder Negative    22. Cedar, red Negative    23. Sweet Gum Negative    24. Pecan Pollen Negative    25. Pine Mix 2+    26. Walnut, Black Pollen 2+    27. Red Mulberry 2+    28. Ash Mix --   +/-   29. Birch Mix --   +/-   30. Beech American Negative    31. Cottonwood, Guinea-Bissau Negative    32. Hickory, White Negative    33. Maple Mix Negative    34. Oak, Guinea[place] Mix Negative    35. Sycamore Eastern 2+    36. Alternaria Alternata Negative    37. Cladosporium Herbarum Negative    38. Aspergillus Mix 2+    39. Penicillium Mix Negative    40. Bipolaris Sorokiniana (Helminthosporium) Negative    41. Drechslera Spicifera (Curvularia) Negative    42. Mucor Plumbeus Negative    43. Fusarium Moniliforme Negative    44. Aureobasidium Pullulans (pullulara) Negative    45. Rhizopus Oryzae Negative    46. Botrytis Cinera 2+    47. Epicoccum Nigrum 2+    48. Phoma Betae --   +/-   49. Dust Mite Mix 2+    50. Cat Hair 10,000 BAU/ml Negative    51.  Dog Epithelia Negative    52. Mixed Feathers Negative    53. Horse Epithelia 2+    54. Cockroach, German 2+    55. Tobacco Leaf 2+          Intradermal - 05/19/24 1436     Time Antigen Placed 1436    Allergen Manufacturer Jestine    Location Arm    Number of Test 8    Control Negative    Bahia Negative    [place]  Negative    Mold 1 Negative    Mold 3 Negative    Mold 4 Negative    Cat Negative    Dog Negative          Previous notes and tests were reviewed. The plan was reviewed with the patient/family, and all questions/concerned were addressed.  It was my pleasure to see Analena today and participate in her care. Please feel free to contact me with any questions or concerns.  Sincerely,  Orlan Cramp, DO  Allergy & Immunology  [facility]  [place] office: [phone] [facility] office: [phone]

## 2024-05-19 ENCOUNTER — Ambulatory Visit (INDEPENDENT_AMBULATORY_CARE_PROVIDER_SITE_OTHER): Admitting: Allergy

## 2024-05-19 ENCOUNTER — Encounter: Payer: Self-pay | Admitting: Allergy

## 2024-05-19 DIAGNOSIS — J3089 Other allergic rhinitis: Secondary | ICD-10-CM

## 2024-05-19 DIAGNOSIS — J4540 Moderate persistent asthma, uncomplicated: Secondary | ICD-10-CM

## 2024-05-19 MED ORDER — TRIAMCINOLONE ACETONIDE 55 MCG/ACT NA AERO: 1.0000 | INHALATION_SPRAY | Freq: Every day | NASAL | 3 refills | Status: AC | PRN

## 2024-05-19 MED ORDER — AZELASTINE HCL 0.1 % NA SOLN: 1.0000 | Freq: Two times a day (BID) | NASAL | 3 refills | Status: DC | PRN

## 2024-05-19 NOTE — Patient Instructions (Addendum)
 Today's skin testing positive to grass, ragweed, weed, trees, mold, dust mites, horse, cockroach and tobacco leaf.  Results given.  Environmental allergies Start environmental control measures as below. Use over the counter antihistamines such as Zyrtec (cetirizine), Claritin (loratadine), Allegra (fexofenadine), or Xyzal (levocetirizine) daily as needed. May switch antihistamines every few months. Continue Singulair (montelukast) 10mg  daily at night. Use Nasacort (triamcinolone) nasal spray 1-2 sprays per nostril once a day as needed for nasal congestion.  Use azelastine nasal spray 1-2 sprays per nostril twice a day as needed for runny nose/drainage. Nasal saline spray (i.e., Simply Saline) or nasal saline lavage (i.e., NeilMed) is recommended as needed and prior to medicated nasal sprays. Consider allergy injections for long term control if above medications do not help the symptoms - handout given.  2 injections.   Asthma - Daily controller medication(s): Breztri 160/9/4.42mcg two puffs twice daily with spacer and rinse mouth afterwards. - Prior to physical activity: albuterol 2 puffs 10-15 minutes before physical activity. - Rescue medications: albuterol 4 puffs every 4-6 hours as needed  Return in about 3 months (around 08/19/2024). With Dr. Iva.   Reducing Pollen Exposure Pollen seasons: trees (spring), grass (summer) and ragweed/weeds (fall). Keep windows closed in your home and car to lower pollen exposure.  Install air conditioning in the bedroom and throughout the house if possible.  Avoid going out in dry windy days - especially early morning. Pollen counts are highest between 5 - 10 AM and on dry, hot and windy days.  Save outside activities for late afternoon or after a heavy rain, when pollen levels are lower.  Avoid mowing of grass if you have grass pollen allergy. Be aware that pollen can also be transported indoors on people and pets.  Dry your clothes in an  automatic dryer rather than hanging them outside where they might collect pollen.  Rinse hair and eyes before bedtime. Mold Control Mold and fungi can grow on a variety of surfaces provided certain temperature and moisture conditions exist.  Outdoor molds grow on plants, decaying vegetation and soil. The major outdoor mold, Alternaria and Cladosporium, are found in very high numbers during hot and dry conditions. Generally, a late summer - fall peak is seen for common outdoor fungal spores. Rain will temporarily lower outdoor mold spore count, but counts rise rapidly when the rainy period ends. The most important indoor molds are Aspergillus and Penicillium. Dark, humid and poorly ventilated basements are ideal sites for mold growth. The next most common sites of mold growth are the bathroom and the kitchen. Outdoor (Seasonal) Mold Control Use air conditioning and keep windows closed. Avoid exposure to decaying vegetation. Avoid leaf raking. Avoid grain handling. Consider wearing a face mask if working in moldy areas.  Indoor (Perennial) Mold Control  Maintain humidity below 50%. Get rid of mold growth on hard surfaces with water, detergent and, if necessary, 5% bleach (do not mix with other cleaners). Then dry the area completely. If mold covers an area more than 10 square feet, consider hiring an indoor environmental professional. For clothing, washing with soap and water is best. If moldy items cannot be cleaned and dried, throw them away. Remove sources e.g. contaminated carpets. Repair and seal leaking roofs or pipes. Using dehumidifiers in damp basements may be helpful, but empty the water and clean units regularly to prevent mildew from forming. All rooms, especially basements, bathrooms and kitchens, require ventilation and cleaning to deter mold and mildew growth. Avoid carpeting on concrete or damp  floors, and storing items in damp areas. Control of House Dust Mite Allergen Dust mite  allergens are a common trigger of allergy and asthma symptoms. While they can be found throughout the house, these microscopic creatures thrive in warm, humid environments such as bedding, upholstered furniture and carpeting. Because so much time is spent in the bedroom, it is essential to reduce mite levels there.  Encase pillows, mattresses, and box springs in special allergen-proof fabric covers or airtight, zippered plastic covers.  Bedding should be washed weekly in hot water (130 F) and dried in a hot dryer. Allergen-proof covers are available for comforters and pillows that can't be regularly washed.  Wash the allergy-proof covers every few months. Minimize clutter in the bedroom. Keep pets out of the bedroom.  Keep humidity less than 50% by using a dehumidifier or air conditioning. You can buy a humidity measuring device called a hygrometer to monitor this.  If possible, replace carpets with hardwood, linoleum, or washable area rugs. If that's not possible, vacuum frequently with a vacuum that has a HEPA filter. Remove all upholstered furniture and non-washable window drapes from the bedroom. Remove all non-washable stuffed toys from the bedroom.  Wash stuffed toys weekly. Pet Allergen Avoidance: Contrary to popular opinion, there are no "hypoallergenic" breeds of dogs or cats. That is because people are not allergic to an animal's hair, but to an allergen found in the animal's saliva, dander (dead skin flakes) or urine. Pet allergy symptoms typically occur within minutes. For some people, symptoms can build up and become most severe 8 to 12 hours after contact with the animal. People with severe allergies can experience reactions in public places if dander has been transported on the pet owners' clothing. Keeping an animal outdoors is only a partial solution, since homes with pets in the yard still have higher concentrations of animal allergens. Before getting a pet, ask your allergist to  determine if you are allergic to animals. If your pet is already considered part of your family, try to minimize contact and keep the pet out of the bedroom and other rooms where you spend a great deal of time. As with dust mites, vacuum carpets often or replace carpet with a hardwood floor, tile or linoleum. High-efficiency particulate air (HEPA) cleaners can reduce allergen levels over time. While dander and saliva are the source of cat and dog allergens, urine is the source of allergens from rabbits, hamsters, mice and israel pigs; so ask a non-allergic family member to clean the animal's cage. If you have a pet allergy, talk to your allergist about the potential for allergy immunotherapy (allergy shots). This strategy can often provide long-term relief. Cockroach Allergen Avoidance Cockroaches are often found in the homes of densely populated urban areas, schools or commercial buildings, but these creatures can lurk almost anywhere. This does not mean that you have a dirty house or living area. Block all areas where roaches can enter the home. This includes crevices, wall cracks and windows.  Cockroaches need water to survive, so fix and seal all leaky faucets and pipes. Have an exterminator go through the house when your family and pets are gone to eliminate any remaining roaches. Keep food in lidded containers and put pet food dishes away after your pets are done eating. Vacuum and sweep the floor after meals, and take out garbage and recyclables. Use lidded garbage containers in the kitchen. Wash dishes immediately after use and clean under stoves, refrigerators or toasters where crumbs can accumulate.  Wipe off the stove and other kitchen surfaces and cupboards regularly.

## 2024-05-20 ENCOUNTER — Other Ambulatory Visit: Payer: Self-pay | Admitting: *Deleted

## 2024-05-20 MED ORDER — AZELASTINE HCL 0.1 % NA SOLN: 1.0000 | Freq: Two times a day (BID) | NASAL | 1 refills | Status: DC | PRN

## 2024-05-25 ENCOUNTER — Other Ambulatory Visit: Payer: Self-pay

## 2024-05-25 MED ORDER — BREZTRI AEROSPHERE 160-9-4.8 MCG/ACT IN AERO: 2.0000 | INHALATION_SPRAY | Freq: Two times a day (BID) | RESPIRATORY_TRACT | 2 refills | Status: AC

## 2024-05-25 MED ORDER — BREZTRI AEROSPHERE 160-9-4.8 MCG/ACT IN AERO: 2.0000 | INHALATION_SPRAY | Freq: Two times a day (BID) | RESPIRATORY_TRACT | 5 refills | Status: DC

## 2024-06-24 ENCOUNTER — Other Ambulatory Visit: Payer: Medicare Other

## 2024-07-20 ENCOUNTER — Ambulatory Visit
Admission: RE | Admit: 2024-07-20 | Discharge: 2024-07-20 | Disposition: A | Discharge status: Home / Self Care | Source: Ambulatory Visit | Attending: Obstetrics and Gynecology | Admitting: Obstetrics and Gynecology

## 2024-07-20 DIAGNOSIS — Z1239 Encounter for other screening for malignant neoplasm of breast: Secondary | ICD-10-CM

## 2024-07-20 MED ORDER — GADOPICLENOL 0.5 MMOL/ML IV SOLN
7.5000 mL | Freq: Once | INTRAVENOUS | Status: AC | PRN
  Administered 2024-07-20: 7.5 mL via INTRAVENOUS

## 2024-08-31 ENCOUNTER — Encounter: Payer: Self-pay | Admitting: Neurology

## 2024-08-31 ENCOUNTER — Other Ambulatory Visit: Payer: Self-pay

## 2024-08-31 ENCOUNTER — Encounter: Payer: Self-pay | Admitting: Allergy & Immunology

## 2024-08-31 ENCOUNTER — Ambulatory Visit: Admitting: Allergy & Immunology

## 2024-08-31 ENCOUNTER — Ambulatory Visit (INDEPENDENT_AMBULATORY_CARE_PROVIDER_SITE_OTHER): Admitting: Neurology

## 2024-08-31 VITALS — BP 130/80 | HR 68 | Temp 97.9°F | Resp 18 | Ht 68.11 in | Wt 158.1 lb

## 2024-08-31 VITALS — BP 130/52 | HR 87 | Ht 68.11 in | Wt 154.0 lb

## 2024-08-31 DIAGNOSIS — M5416 Radiculopathy, lumbar region: Secondary | ICD-10-CM | POA: Diagnosis not present

## 2024-08-31 DIAGNOSIS — J3089 Other allergic rhinitis: Secondary | ICD-10-CM

## 2024-08-31 DIAGNOSIS — J4540 Moderate persistent asthma, uncomplicated: Secondary | ICD-10-CM | POA: Diagnosis not present

## 2024-08-31 DIAGNOSIS — R253 Fasciculation: Secondary | ICD-10-CM

## 2024-08-31 DIAGNOSIS — J302 Other seasonal allergic rhinitis: Secondary | ICD-10-CM | POA: Diagnosis not present

## 2024-08-31 NOTE — Progress Notes (Signed)
 Follow-up Visit   Date: 08/31/2024    Deniqua Perry MRN: [id number] DOB: Aug 28, 1956    Lorine Iannaccone is a 68 y.o. right-handed Caucasian female with iabetes mellitus, hypertension, GERD, and hyperlipidemia returning to the clinic for follow-up of right leg paresthesias.  The patient was accompanied to the clinic by self.  IMPRESSION/PLAN: Benign fasciculations due to lumbar radiculopathy.  She does not have any weakness on exam.  Reassured patient that she does not have ALS.  Repeat NCS/EMG was discussed, but she reports recently having one at orthopaedics.  Fasciculations can be seen with radiculopathy, which I believe is what is causing hers.  I recommend that she continue to monitor and if she develops any new weakness, return to see me.  --------------------------------------------- History of present illness: Over the past several years, she complains of muscle spasm in the right posterior leg.  She has been seeing Dr. Duwayne for this and had MRI lumbar spine which shows degenerative disc disease at L4-5 and L5-S1 moderately severe at L2-3.  There is lateral recess stenosis at L4-5 on the left.  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  Review of EMG show suboptimal testing of there sensory and motor responses.   She tells me that she feels abnormal sensation on her right leg especially when she is laying down to rest.  She denies the urge to move the legs, but states that she does not have the feeling when she is active.  She does not have pain and she does not observe any muscle twitches. No numbness/tingling of the legs.  She has tried gabapentin 300mg  at bedtime with no improvement.   She does not want to do any more testing and tells me that she cancelled her CT myelogram which was ordered by Dr. Duwayne.   UPDATE 12/23/2023:  She is here for follow-up visit.  She had repeat NCS/EMG of the legs which was normal.  She continues to have tingling down  the right leg.  She also complains of back pain and hip pain.  She has tried PT and injections.  She does not want to take gabapentin or medication to mask the pain.   No leg weakness.  She walks unassisted.  UPDATE 08/31/2024:  She is here with new complaints of right calf fasciculations. She began noticing these soon after her last visit.  She has been getting ESI for lumbar radiculopathy and was recommended to follow-up for her twitches.  No new weakness.  She continues to have right leg tingling discomfort.  She feels that the muscle twitches became less bothersome after her last steroid injection. She has a video of her right calf showing active fasciculations. None were seen in the office today.   Medications:  Current Outpatient Medications on File Prior to Visit  Medication Sig Dispense Refill   acetaminophen  (TYLENOL ) 500 MG tablet Take 500 mg by mouth every 6 (six) hours as needed.     azelastine  (ASTELIN ) 0.1 % nasal spray Place 1-2 sprays into both nostrils 2 (two) times daily as needed (nasal drainage). Use in each nostril as directed 90 mL 1   budesonide-glycopyrrolate -formoterol (BREZTRI  AEROSPHERE) 160-9-4.8 MCG/ACT AERO inhaler Inhale 2 puffs into the lungs in the morning and at bedtime. 32.1 g 2   cyanocobalamin (VITAMIN B12) 1000 MCG tablet Take 1,000 mcg by mouth daily.     empagliflozin (JARDIANCE) 25 MG TABS tablet Take 25 mg by mouth daily. (Patient taking differently:  Take 10 mg by mouth daily.)     fluticasone (FLOVENT HFA) 44 MCG/ACT inhaler Inhale into the lungs.     lisinopril (ZESTRIL) 5 MG tablet Take 5 mg by mouth once.     metFORMIN (GLUCOPHAGE-XR) 500 MG 24 hr tablet Take by mouth daily. Take two tablets daily with evening meal     Multiple Vitamins-Minerals (CENTRUM SILVER 50+WOMEN PO) Take by mouth.     omeprazole (PRILOSEC) 40 MG capsule Take 40 mg by mouth daily.     pantoprazole (PROTONIX) 40 MG tablet 1 tablet Orally Once a day for 30 day(s)     pyridOXINE  (VITAMIN B6) 25 MG tablet Take 25 mg by mouth daily.     rosuvastatin (CRESTOR) 10 MG tablet Take 10 mg by mouth daily.     triamcinolone  (NASACORT ) 55 MCG/ACT AERO nasal inhaler Place 1-2 sprays into the nose daily as needed (nasal congestion). 1 each 3   vitamin C  (VITAMIN C ) 1000 MG tablet Take 1 tablet (1,000 mg total) by mouth daily. 120 tablet 1   Cholecalciferol (D3 VITAMIN PO) Take by mouth. With K2     Cholecalciferol (VITAMIN D) 125 MCG (5000 UT) CAPS Take by mouth.     glucose blood (FREESTYLE LITE) test strip 1 each by Other route as needed for other. Use as instructed (Patient not taking: Reported on 08/31/2024)     Lancets 28G MISC by Does not apply route. (Patient not taking: Reported on 08/31/2024)     No current facility-administered medications on file prior to visit.    Allergies: No Known Allergies  Vital Signs:  BP (!) 130/52   Pulse 87   Ht 5' 8.11 (1.73 m)   Wt 154 lb (69.9 kg)   SpO2 93%   BMI 23.34 kg/m    Neurological Exam: MENTAL STATUS including orientation to time, place, person, recent and remote memory, attention span and concentration, language, and fund of knowledge is normal.  Speech is not dysarthric.  CRANIAL NERVES:    Pupils equal round and reactive to light.  Normal conjugate, extra-ocular eye movements in all directions of gaze.  No ptosis.  Face is symmetric.   MOTOR:  Motor strength is 5/5 in all extremities.  No atrophy.  No fasciculations or abnormal movements.  No pronator drift.  Tone is normal.    MSRs:  Reflexes are 2+/4 throughout.  SENSORY:  Intact to vibration throughout.  COORDINATION/GAIT:   Intact rapid alternating movements bilaterally.  Gait narrow based and stable. Stressed and tandem gait intact.   Data: NCS/EMG of the legs 10/10/2023: This is a normal study of the lower extremities.  In particular, there is no evidence of a large fiber sensorimotor polyneuropathy or lumbosacral radiculopathy.   MRI lumbar spine  11/13/2022: End-stage disc degeneration at L4-5 and L5-S1 moderately severe at L2-3. There is lateral recess stenosis at L4-5 on the left.    Thank you for allowing me to participate in patient's care.  If I can answer any additional questions, I would be pleased to do so.    Sincerely,    Tashia Leiterman K. Tobie, DO

## 2024-08-31 NOTE — Progress Notes (Signed)
 FOLLOW UP  Date of Service/Encounter:  08/31/24   Assessment:   Chronic bilateral back pain   Perennial and seasonal allergic rhinitis (grasses, weeds, trees, indoor molds, outdoor molds, dust mite, horse, cockroach, and tobacco)   Moderate persistent asthma, uncomplicated   Type 2 diabetes - sees Endocrinology   Paresthesias - sees Neurology   History of cancer - 10+ years ago  Plan/Recommendations:   Perennial and seasonal allergic rhinitis (grasses, weeds, trees, indoor molds, outdoor molds, dust mite, horse, cockroach, and tobacco) - I would consider starting allergy  shots for long-term control. - Continue with Astelin  two sprays per nostril up to twice daily if needed.  - Try adding that at night to see if that helps with your morning sneezes. - Continue with montelukast 10mg  daily.   2. Moderate persistent asthma, uncomplicated  - Lung testing looks good today.  - I am glad that the Breztri  is working well.  - We are not going to make any medication changes at this time.  - Daily controller medication(s): Breztri  160/9/4.24mcg two puffs twice daily - Prior to physical activity: albuterol 2 puffs 10-15 minutes before physical activity. - Rescue medications: albuterol 4 puffs every 4-6 hours as needed - Asthma control goals:  * Full participation in all desired activities (may need albuterol before activity) * Albuterol use two time or less a week on average (not counting use with activity) * Cough interfering with sleep two time or less a month * Oral steroids no more than once a year * No hospitalizations  3. Return in about 6 months (around 03/01/2025). You can have the follow up appointment with Dr. Iva or a Nurse Practicioner (our Nurse Practitioners are excellent and always have Physician oversight!).   Subjective:   Karla Dyer is a 68 y.o. female presenting today for follow up of  Chief Complaint  Patient presents with   Follow-up   Allergies     Sneezing, runny nose.    Karla Dyer has a history of the following: Patient Active Problem List   Diagnosis Date Noted   Vitreous opacities of both eyes 10/16/2020   Posterior vitreous detachment of left eye 10/16/2020   Posterior vitreous detachment of right eye 10/16/2020   Diabetes mellitus without complication (HCC) 10/16/2020   Distal radius fracture, right 04/26/2016    History obtained from: chart review and patient.  Discussed the use of AI scribe software for clinical note transcription with the patient and/or guardian, who gave verbal consent to proceed.  Karla Dyer is a 68 y.o. female presenting for a follow up visit.  She was last seen in January 2025.  At that time, we decided environmental allergy  testing.  Lung testing was decent.  We changed her to Breztri  to replace Advair.  We continue with albuterol as needed.  She underwent skin testing in July 2025 that was positive to grasses, weeds, trees, indoor molds, outdoor molds, dust mite, horse, cockroach, and tobacco.  Intradermal's were negative.  Since the last visit she has done well.  Asthma/Respiratory Symptom History: Her breathing has improved significantly with the use of Breztri , which she initially thought was for asthma but later realized is for COPD. It helps her when she lays down. She has a history of using a CPAP machine for obstructive sleep apnea but discontinued it after a couple of months due to the noise keeping her awake. She uses an air purifier machine to help with sleep.  Allergic Rhinitis Symptom History: She experiences persistent sneezing  and rhinorrhea, particularly in the mornings, despite changing her air filters regularly and using a nasal spray, which she refers to as 'Celestine'. She is unsure of the exact dosing but believes she uses two sprays once a day. She also takes montelukast and has been prescribed an antihistamine, though she cannot recall the specific name. Her environmental allergies  are significant, and she sneezes about ten times every morning.  She has a history of diabetes, which is under control with her last readings being 6.7 and 6.8. She is currently taking Jardiance 25 mg. She has stopped using Flovent and albuterol as her respiratory symptoms have improved. She spends a significant amount of time outdoors, approximately seven hours a day, working in her yard.  She describes an episode following an epidural where she experienced dysphonia and difficulty swallowing, which lasted for about four to five weeks. She did not seek immediate medical attention as she did not believe it was a stroke. She attributes this episode to the epidural, noting that she often experiences rare side effects.  She maintains her weight under 155 pounds and expresses a dislike for being indoors during the winter. She enjoys gardening and has a large collection of fig trees, which she grows and gives away. She lives alone in a three-story house and has concerns about safety in her neighborhood.   Otherwise, there have been no changes to her past medical history, surgical history, family history, or social history.    Review of systems otherwise negative other than that mentioned in the HPI.    Objective:   Blood pressure 130/80, pulse 68, temperature 97.9 F (36.6 C), temperature source Temporal, resp. rate 18, height 5' 8.11 (1.73 m), weight 158 lb 1.6 oz (71.7 kg), SpO2 95%. Body mass index is 23.96 kg/m.    Physical Exam Vitals reviewed.  Constitutional:      Appearance: She is well-developed.     Comments: Very talkative per usual. Very pleasant.   HENT:     Head: Normocephalic and atraumatic.     Right Ear: Tympanic membrane, ear canal and external ear normal. No drainage, swelling or tenderness. Tympanic membrane is not injected, scarred, erythematous, retracted or bulging.     Left Ear: Tympanic membrane, ear canal and external ear normal. No drainage, swelling or  tenderness. Tympanic membrane is not injected, scarred, erythematous, retracted or bulging.     Nose: No nasal deformity, septal deviation, mucosal edema or rhinorrhea.     Right Turbinates: Enlarged, swollen and pale.     Left Turbinates: Enlarged, swollen and pale.     Right Sinus: No maxillary sinus tenderness or frontal sinus tenderness.     Left Sinus: No maxillary sinus tenderness or frontal sinus tenderness.     Comments: No polyps noted.     Mouth/Throat:     Mouth: Mucous membranes are not pale and not dry.     Pharynx: Uvula midline.  Eyes:     General: Allergic shiner present.        Right eye: No discharge.        Left eye: No discharge.     Conjunctiva/sclera: Conjunctivae normal.     Right eye: Right conjunctiva is not injected. No chemosis.    Left eye: Left conjunctiva is not injected. No chemosis.    Pupils: Pupils are equal, round, and reactive to light.  Cardiovascular:     Rate and Rhythm: Normal rate and regular rhythm.     Heart sounds: Normal heart sounds.  Pulmonary:     Effort: Pulmonary effort is normal. No tachypnea, accessory muscle usage or respiratory distress.     Breath sounds: Normal breath sounds. No wheezing, rhonchi or rales.     Comments: Moving air well in all lung fields. No increased work of breathing noted.  Chest:     Chest wall: No tenderness.  Abdominal:     Tenderness: There is no abdominal tenderness. There is no guarding or rebound.  Lymphadenopathy:     Head:     Right side of head: No submandibular, tonsillar or occipital adenopathy.     Left side of head: No submandibular, tonsillar or occipital adenopathy.     Cervical: No cervical adenopathy.  Skin:    Coloration: Skin is not pale.     Findings: No abrasion, erythema, petechiae or rash. Rash is not papular, urticarial or vesicular.  Neurological:     Mental Status: She is alert.  Psychiatric:        Behavior: Behavior is cooperative.      Diagnostic studies:     Spirometry: results normal (FEV1: 2.42/97%, FVC: 3.18/98%, FEV1/FVC: 76%).    Spirometry consistent with normal pattern.   Allergy  Studies: none       Marty Shaggy, MD  [facility] 

## 2024-08-31 NOTE — Patient Instructions (Addendum)
 Perennial and seasonal allergic rhinitis(grasses, weeds, trees, indoor molds, outdoor molds, dust mite, horse, cockroach, and tobacco) - I would consider starting allergy  shots for long-term control. - Continue with Astelin  two sprays per nostril up to twice daily if needed.  - Try adding that at night to see if that helps with your morning sneezes. - Continue with montelukast 10mg  daily.   2. Moderate persistent asthma, uncomplicated  - Lung testing looks good today.  - I am glad that the Breztri  is working well.  - We are not going to make any medication changes at this time.  - Daily controller medication(s): Breztri  160/9/4.29mcg two puffs twice daily - Prior to physical activity: albuterol 2 puffs 10-15 minutes before physical activity. - Rescue medications: albuterol 4 puffs every 4-6 hours as needed - Asthma control goals:  * Full participation in all desired activities (may need albuterol before activity) * Albuterol use two time or less a week on average (not counting use with activity) * Cough interfering with sleep two time or less a month * Oral steroids no more than once a year * No hospitalizations  3. Return in about 6 months (around 03/01/2025). You can have the follow up appointment with Dr. Iva or a Nurse Practicioner (our Nurse Practitioners are excellent and always have Physician oversight!).    Please inform us  of any Emergency Department visits, hospitalizations, or changes in symptoms. Call us  before going to the ED for breathing or allergy  symptoms since we might be able to fit you in for a sick visit. Feel free to contact us  anytime with any questions, problems, or concerns.  It was a pleasure to see you again today!  Websites that have reliable patient information: 1. American Academy of Asthma, Allergy , and Immunology: www.aaaai.org 2. Food Allergy  Research and Education (FARE): foodallergy.org 3. Mothers of Asthmatics:  http://www.asthmacommunitynetwork.org 4. American College of Allergy , Asthma, and Immunology: www.acaai.org      "Like" us  on Facebook and Instagram for our latest updates!      A healthy democracy works best when [employer] participate! Make sure you are registered to vote! If you have moved or changed any of your contact information, you will need to get this updated before voting! Scan the QR codes below to learn more!        Try leaving your fig trees at the [place] [employer] [place] (near their garden):

## 2024-09-03 ENCOUNTER — Encounter: Payer: Self-pay | Admitting: Allergy & Immunology

## 2024-10-01 ENCOUNTER — Other Ambulatory Visit: Payer: Self-pay | Admitting: Allergy

## 2025-02-24 ENCOUNTER — Ambulatory Visit: Admitting: Allergy & Immunology
